# Patient Record
Sex: Female | Born: 1935 | Race: White | Hispanic: No | Smoking: Never smoker
Health system: Southern US, Community
[De-identification: ages and names within clinical notes are randomized; demographics above are authoritative.]

## PROBLEM LIST (undated history)

## (undated) DIAGNOSIS — F0390 Unspecified dementia without behavioral disturbance: Secondary | ICD-10-CM

## (undated) DIAGNOSIS — R569 Unspecified convulsions: Secondary | ICD-10-CM

## (undated) DIAGNOSIS — E039 Hypothyroidism, unspecified: Secondary | ICD-10-CM

## (undated) DIAGNOSIS — I2510 Atherosclerotic heart disease of native coronary artery without angina pectoris: Secondary | ICD-10-CM

## (undated) DIAGNOSIS — I10 Essential (primary) hypertension: Secondary | ICD-10-CM

## (undated) DIAGNOSIS — R3981 Functional urinary incontinence: Secondary | ICD-10-CM

## (undated) DIAGNOSIS — G8929 Other chronic pain: Secondary | ICD-10-CM

## (undated) DIAGNOSIS — M25559 Pain in unspecified hip: Secondary | ICD-10-CM

## (undated) DIAGNOSIS — L97929 Non-pressure chronic ulcer of unspecified part of left lower leg with unspecified severity: Secondary | ICD-10-CM

## (undated) DIAGNOSIS — C801 Malignant (primary) neoplasm, unspecified: Secondary | ICD-10-CM

## (undated) DIAGNOSIS — M549 Dorsalgia, unspecified: Secondary | ICD-10-CM

## (undated) HISTORY — PX: OTHER SURGICAL HISTORY: SHX169

## (undated) HISTORY — PX: ABDOMINAL HYSTERECTOMY: SHX81

## (undated) HISTORY — PX: TOTAL HIP ARTHROPLASTY: SHX124

## (undated) HISTORY — PX: BACK SURGERY: SHX140

---

## 2013-06-30 ENCOUNTER — Emergency Department (HOSPITAL_COMMUNITY): Payer: Medicare Other

## 2013-06-30 ENCOUNTER — Observation Stay (HOSPITAL_COMMUNITY)
Admission: EM | Admit: 2013-06-30 | Discharge: 2013-07-02 | Disposition: A | Discharge status: Home / Self Care | Payer: Medicare Other | Attending: Family Medicine | Admitting: Family Medicine

## 2013-06-30 ENCOUNTER — Encounter (HOSPITAL_COMMUNITY): Payer: Self-pay | Admitting: Emergency Medicine

## 2013-06-30 DIAGNOSIS — D721 Eosinophilia, unspecified: Secondary | ICD-10-CM | POA: Diagnosis present

## 2013-06-30 DIAGNOSIS — G40909 Epilepsy, unspecified, not intractable, without status epilepticus: Secondary | ICD-10-CM | POA: Insufficient documentation

## 2013-06-30 DIAGNOSIS — R55 Syncope and collapse: Principal | ICD-10-CM | POA: Diagnosis present

## 2013-06-30 DIAGNOSIS — F0390 Unspecified dementia without behavioral disturbance: Secondary | ICD-10-CM | POA: Insufficient documentation

## 2013-06-30 DIAGNOSIS — L97929 Non-pressure chronic ulcer of unspecified part of left lower leg with unspecified severity: Secondary | ICD-10-CM | POA: Diagnosis present

## 2013-06-30 DIAGNOSIS — R569 Unspecified convulsions: Secondary | ICD-10-CM | POA: Diagnosis present

## 2013-06-30 DIAGNOSIS — E039 Hypothyroidism, unspecified: Secondary | ICD-10-CM | POA: Diagnosis present

## 2013-06-30 DIAGNOSIS — L97909 Non-pressure chronic ulcer of unspecified part of unspecified lower leg with unspecified severity: Secondary | ICD-10-CM | POA: Insufficient documentation

## 2013-06-30 HISTORY — DX: Atherosclerotic heart disease of native coronary artery without angina pectoris: I25.10

## 2013-06-30 HISTORY — DX: Essential (primary) hypertension: I10

## 2013-06-30 LAB — CBC WITH DIFFERENTIAL/PLATELET
Basophils Relative: 1 % (ref 0–1)
Hemoglobin: 12.6 g/dL (ref 12.0–15.0)
Lymphocytes Relative: 25 % (ref 12–46)
MCHC: 35 g/dL (ref 30.0–36.0)
Monocytes Relative: 8 % (ref 3–12)
Neutro Abs: 5 10*3/uL (ref 1.7–7.7)
Neutrophils Relative %: 53 % (ref 43–77)
RBC: 3.74 MIL/uL — ABNORMAL LOW (ref 3.87–5.11)
WBC: 9.3 10*3/uL (ref 4.0–10.5)

## 2013-06-30 LAB — BASIC METABOLIC PANEL
BUN: 19 mg/dL (ref 6–23)
Chloride: 107 mEq/L (ref 96–112)
GFR calc Af Amer: 65 mL/min — ABNORMAL LOW (ref >90)
Potassium: 3.8 mEq/L (ref 3.5–5.1)

## 2013-06-30 LAB — TROPONIN I: Troponin I: <0.3 ng/mL (ref <0.30)

## 2013-06-30 NOTE — ED Notes (Signed)
MD at bedside. 

## 2013-06-30 NOTE — ED Notes (Signed)
Patient presents to ER via [facility] with c/o syncopal episode.  Patient states she stood up and everything went black.  Patient states she landed on the bed.

## 2013-06-30 NOTE — ED Notes (Signed)
Pt reports she stood up out of bed "blacked out" and fell back onto the bed. States that this has happened before in the past and has informed her PCP with no answers being given. Denies injury.

## 2013-06-30 NOTE — ED Provider Notes (Signed)
CSN: [id number]     Arrival date & time 06/30/13  2108 History  This chart was scribed for [employer]. Rubin Payor, MD by Greggory Stallion, ED Scribe. This patient was seen in room APA05/APA05 and the patient's care was started at 10:15 PM.   Chief Complaint  Patient presents with  . Loss of Consciousness   The history is provided by the patient. No language interpreter was used.    HPI Comments: [name] [name] is a 79 y.o. female who presents to the Emergency Department complaining of sudden loss of consciousness. She states she was standing in front of the closet and just blacked out. Pt states she landed on the bed. She felt fine until then. Pt denies CP, SOB, headache, dysuria, urinary frequency and confusion as associated symptoms.   Past Medical History  Diagnosis Date  . Hypertension   . Coronary artery disease    History reviewed. No pertinent past surgical history. No family history on file. History  Substance Use Topics  . Smoking status: Never Smoker   . Smokeless tobacco: Not on file  . Alcohol Use: No   OB History   Grav Para Term Preterm Abortions TAB SAB Ect Mult Living                 Review of Systems  Respiratory: Negative for shortness of breath.   Cardiovascular: Negative for chest pain.  Genitourinary: Negative for dysuria and frequency.  Neurological: Positive for syncope. Negative for headaches.  Psychiatric/Behavioral: Negative for confusion.  All other systems reviewed and are negative.    Allergies  Feldene  Home Medications   Current Outpatient Rx  Name  Route  Sig  Dispense  Refill  . aspirin EC 81 MG tablet   Oral   Take 81 mg by mouth daily.         . Calcium Carbonate-Vitamin D (CALCIUM 600 + D PO)   Oral   Take 1 tablet by mouth daily.         . celecoxib (CELEBREX) 200 MG capsule   Oral   Take 200 mg by mouth daily.         . citalopram (CELEXA) 20 MG tablet   Oral   Take 20 mg by mouth daily.         . Cyanocobalamin  (VITAMIN B-12) 2500 MCG SUBL   Sublingual   Place 1 tablet under the tongue daily.         Marland Kitchen levothyroxine (SYNTHROID, LEVOTHROID) 50 MCG tablet   Oral   Take 50 mcg by mouth daily before breakfast.         . omeprazole (PRILOSEC) 20 MG capsule   Oral   Take 20 mg by mouth daily before breakfast.         . silver sulfADIAZINE (SILVADENE) 1 % cream   Topical   Apply 1 application topically 2 (two) times daily. Applied to wound on left leg twice daily and cleaned with Wound Wash         . simvastatin (ZOCOR) 20 MG tablet   Oral   Take 20 mg by mouth at bedtime.         . topiramate (TOPAMAX) 100 MG tablet   Oral   Take 100 mg by mouth at bedtime.         . vitamin C (ASCORBIC ACID) 500 MG tablet   Oral   Take 500 mg by mouth daily.         Marland Kitchen  vitamin E 200 UNIT capsule   Oral   Take 200 Units by mouth daily.         . vitamin E 400 UNIT capsule   Oral   Take 400 Units by mouth every Wednesday.          BP 180/78  Pulse 71  Temp(Src) 98 F (36.7 C) (Oral)  Resp 18  Ht 5\' 2"  (1.575 m)  Wt 150 lb (68.04 kg)  BMI 27.43 kg/m2  SpO2 97%  Physical Exam  Nursing note and vitals reviewed. Constitutional: She is oriented to person, place, and time. She appears well-developed and well-nourished. No distress.  Mild forgetfulness.   HENT:  Head: Normocephalic and atraumatic.  Eyes: EOM are normal.  Neck: Normal range of motion.  Cardiovascular: Normal rate, regular rhythm and normal heart sounds.   Pulmonary/Chest: Effort normal and breath sounds normal.  Abdominal: Soft. She exhibits no distension. There is no tenderness.  Musculoskeletal: Normal range of motion.  Neurological: She is alert and oriented to person, place, and time.  Skin: Skin is warm and dry.  Psychiatric: She has a normal mood and affect. Judgment normal.    ED Course   Procedures (including critical care time)  COORDINATION OF CARE: 10:20 PM-Discussed treatment plan which  includes EKG and labs with pt at bedside and pt agreed to plan.   Results for orders placed during the hospital encounter of 06/30/13  CBC WITH DIFFERENTIAL      Result Value Range   WBC 9.3  4.0 - 10.5 K/uL   RBC 3.74 (*) 3.87 - 5.11 MIL/uL   Hemoglobin 12.6  12.0 - 15.0 g/dL   HCT 16.1  09.6 - 04.5 %   MCV 96.3  78.0 - 100.0 fL   MCH 33.7  26.0 - 34.0 pg   MCHC 35.0  30.0 - 36.0 g/dL   RDW 40.9  81.1 - 91.4 %   Platelets 195  150 - 400 K/uL   Neutrophils Relative % 53  43 - 77 %   Neutro Abs 5.0  1.7 - 7.7 K/uL   Lymphocytes Relative 25  12 - 46 %   Lymphs Abs 2.3  0.7 - 4.0 K/uL   Monocytes Relative 8  3 - 12 %   Monocytes Absolute 0.7  0.1 - 1.0 K/uL   Eosinophils Relative 13 (*) 0 - 5 %   Eosinophils Absolute 1.2 (*) 0.0 - 0.7 K/uL   Basophils Relative 1  0 - 1 %   Basophils Absolute 0.1  0.0 - 0.1 K/uL  BASIC METABOLIC PANEL      Result Value Range   Sodium 138  135 - 145 mEq/L   Potassium 3.8  3.5 - 5.1 mEq/L   Chloride 107  96 - 112 mEq/L   CO2 21  19 - 32 mEq/L   Glucose, Bld 122 (*) 70 - 99 mg/dL   BUN 19  6 - 23 mg/dL   Creatinine, Ser 7.82  0.50 - 1.10 mg/dL   Calcium 95.6  8.4 - 21.3 mg/dL   GFR calc non Af Amer 56 (*) >90 mL/min   GFR calc Af Amer 65 (*) >90 mL/min  TROPONIN I      Result Value Range   Troponin I <0.30  <0.30 ng/mL   No results found.  Labs Reviewed  CBC WITH DIFFERENTIAL - Abnormal; Notable for the following:    RBC 3.74 (*)    Eosinophils Relative 13 (*)    Eosinophils  Absolute 1.2 (*)    All other components within normal limits  BASIC METABOLIC PANEL - Abnormal; Notable for the following:    Glucose, Bld 122 (*)    GFR calc non Af Amer 56 (*)    GFR calc Af Amer 65 (*)    All other components within normal limits  TROPONIN I  URINALYSIS, ROUTINE W REFLEX MICROSCOPIC   Dg Chest 2 View  06/30/2013   *RADIOLOGY REPORT*  Clinical Data: Syncopal episode, chronic back pain  CHEST - 2 VIEW  Comparison: None.  Findings: Normal heart  size and vascularity.  Mild chronic bronchitic changes and COPD/emphysema.  Negative for CHF or pneumonia.  No effusion or pneumothorax.  Trachea midline.  Chronic compression fracture at approximately L1 on the lateral view.  IMPRESSION: Chronic bronchitic changes and COPD/emphysema.  No superimposed acute process   Original Report Authenticated By: Judie Petit. Shick, M.D.   1. Syncope     Date: 07/01/2013  Rate: 70  Rhythm: normal sinus rhythm  QRS Axis: normal  Intervals: normal  ST/T Wave abnormalities: normal  Conduction Disutrbances:none  Narrative Interpretation:   Old EKG Reviewed: none available   MDM  Patient with syncope while standing in from the closet. EKG and labwork reassuring. She is at her baseline. She will be admitted to internal medicine due to the syncope. Discussed with Dr. Orvan Falconer     I personally performed the services described in this documentation, which was scribed in my presence. The recorded information has been reviewed and is accurate.    Juliet Rude. Rubin Payor, MD 07/01/13 4098

## 2013-07-01 ENCOUNTER — Encounter (HOSPITAL_COMMUNITY): Payer: Self-pay | Admitting: *Deleted

## 2013-07-01 DIAGNOSIS — R569 Unspecified convulsions: Secondary | ICD-10-CM | POA: Diagnosis present

## 2013-07-01 DIAGNOSIS — L97929 Non-pressure chronic ulcer of unspecified part of left lower leg with unspecified severity: Secondary | ICD-10-CM | POA: Diagnosis present

## 2013-07-01 DIAGNOSIS — R55 Syncope and collapse: Secondary | ICD-10-CM | POA: Diagnosis present

## 2013-07-01 DIAGNOSIS — E039 Hypothyroidism, unspecified: Secondary | ICD-10-CM

## 2013-07-01 DIAGNOSIS — I359 Nonrheumatic aortic valve disorder, unspecified: Secondary | ICD-10-CM

## 2013-07-01 LAB — BASIC METABOLIC PANEL
Calcium: 10.3 mg/dL (ref 8.4–10.5)
Chloride: 107 mEq/L (ref 96–112)
Creatinine, Ser: 0.85 mg/dL (ref 0.50–1.10)
GFR calc Af Amer: 66 mL/min — ABNORMAL LOW (ref >90)
Sodium: 139 mEq/L (ref 135–145)

## 2013-07-01 LAB — HEPATIC FUNCTION PANEL
AST: 19 U/L (ref 0–37)
Albumin: 3.7 g/dL (ref 3.5–5.2)

## 2013-07-01 LAB — URINALYSIS, ROUTINE W REFLEX MICROSCOPIC
Hgb urine dipstick: NEGATIVE
Nitrite: NEGATIVE
Specific Gravity, Urine: 1.01 (ref 1.005–1.030)
Urobilinogen, UA: 0.2 mg/dL (ref 0.0–1.0)

## 2013-07-01 LAB — CBC
HCT: 36 % (ref 36.0–46.0)
Platelets: 175 10*3/uL (ref 150–400)
RBC: 3.74 MIL/uL — ABNORMAL LOW (ref 3.87–5.11)
RDW: 12.4 % (ref 11.5–15.5)
WBC: 7.9 10*3/uL (ref 4.0–10.5)

## 2013-07-01 LAB — PROTIME-INR: Prothrombin Time: 13.9 seconds (ref 11.6–15.2)

## 2013-07-01 LAB — MRSA PCR SCREENING: MRSA by PCR: NEGATIVE

## 2013-07-01 LAB — HEMOGLOBIN A1C
Hgb A1c MFr Bld: 5.5 % (ref <5.7)
Mean Plasma Glucose: 111 mg/dL (ref <117)

## 2013-07-01 MED ORDER — ONDANSETRON HCL 4 MG/2ML IJ SOLN: 4.0000 mg | INTRAMUSCULAR | Status: DC | PRN

## 2013-07-01 MED ORDER — BISACODYL 5 MG PO TBEC: 5.0000 mg | DELAYED_RELEASE_TABLET | Freq: Every day | ORAL | Status: DC | PRN

## 2013-07-01 MED ORDER — ENOXAPARIN SODIUM 40 MG/0.4ML ~~LOC~~ SOLN
40.0000 mg | SUBCUTANEOUS | Status: DC
  Administered 2013-07-02: 40 mg via SUBCUTANEOUS
  Filled 2013-07-01: qty 0.4

## 2013-07-01 MED ORDER — LEVOTHYROXINE SODIUM 25 MCG PO TABS
50.0000 ug | ORAL_TABLET | Freq: Every day | ORAL | Status: DC
  Administered 2013-07-01 – 2013-07-02 (×2): 50 ug via ORAL
  Filled 2013-07-01 (×2): qty 2

## 2013-07-01 MED ORDER — POLYETHYLENE GLYCOL 3350 17 G PO PACK: 17.0000 g | PACK | Freq: Every day | ORAL | Status: DC | PRN

## 2013-07-01 MED ORDER — CITALOPRAM HYDROBROMIDE 20 MG PO TABS
20.0000 mg | ORAL_TABLET | Freq: Every day | ORAL | Status: DC
Start: 2013-07-01 — End: 2013-07-02
  Administered 2013-07-01 – 2013-07-02 (×2): 20 mg via ORAL
  Filled 2013-07-01 (×2): qty 1

## 2013-07-01 MED ORDER — POTASSIUM CHLORIDE IN NACL 20-0.9 MEQ/L-% IV SOLN
INTRAVENOUS | Status: DC
  Administered 2013-07-01 – 2013-07-02 (×3): via INTRAVENOUS

## 2013-07-01 MED ORDER — ENOXAPARIN SODIUM 30 MG/0.3ML ~~LOC~~ SOLN
30.0000 mg | SUBCUTANEOUS | Status: DC
  Administered 2013-07-01: 30 mg via SUBCUTANEOUS
  Filled 2013-07-01: qty 0.3

## 2013-07-01 MED ORDER — CELECOXIB 100 MG PO CAPS
200.0000 mg | ORAL_CAPSULE | Freq: Every day | ORAL | Status: DC
  Administered 2013-07-01: 200 mg via ORAL
  Filled 2013-07-01: qty 1
  Filled 2013-07-01 (×3): qty 2

## 2013-07-01 MED ORDER — TRAZODONE HCL 50 MG PO TABS: 25.0000 mg | ORAL_TABLET | Freq: Every evening | ORAL | Status: DC | PRN

## 2013-07-01 MED ORDER — SILVER SULFADIAZINE 1 % EX CREA
1.0000 "application " | TOPICAL_CREAM | Freq: Two times a day (BID) | CUTANEOUS | Status: DC
  Administered 2013-07-01 (×3): 1 via TOPICAL
  Filled 2013-07-01: qty 85

## 2013-07-01 MED ORDER — SIMVASTATIN 20 MG PO TABS
20.0000 mg | ORAL_TABLET | Freq: Every day | ORAL | Status: DC
  Administered 2013-07-01: 20 mg via ORAL
  Filled 2013-07-01: qty 1

## 2013-07-01 MED ORDER — ASPIRIN EC 81 MG PO TBEC
81.0000 mg | DELAYED_RELEASE_TABLET | Freq: Every day | ORAL | Status: DC
Start: 2013-07-01 — End: 2013-07-02
  Administered 2013-07-01 – 2013-07-02 (×2): 81 mg via ORAL
  Filled 2013-07-01 (×2): qty 1

## 2013-07-01 MED ORDER — PANTOPRAZOLE SODIUM 40 MG PO TBEC
40.0000 mg | DELAYED_RELEASE_TABLET | Freq: Every day | ORAL | Status: DC
  Administered 2013-07-01 – 2013-07-02 (×2): 40 mg via ORAL
  Filled 2013-07-01 (×2): qty 1

## 2013-07-01 MED ORDER — ACETAMINOPHEN 325 MG PO TABS: 650.0000 mg | ORAL_TABLET | ORAL | Status: DC | PRN

## 2013-07-01 MED ORDER — FLEET ENEMA 7-19 GM/118ML RE ENEM: 1.0000 | ENEMA | Freq: Once | RECTAL | Status: AC | PRN

## 2013-07-01 MED ORDER — TOPIRAMATE 25 MG PO TABS
ORAL_TABLET | ORAL | Status: AC
  Filled 2013-07-01: qty 4

## 2013-07-01 MED ORDER — SILVER SULFADIAZINE 1 % EX CREA
TOPICAL_CREAM | CUTANEOUS | Status: AC
  Filled 2013-07-01: qty 50

## 2013-07-01 MED ORDER — TOPIRAMATE 100 MG PO TABS
100.0000 mg | ORAL_TABLET | Freq: Every day | ORAL | Status: DC
  Administered 2013-07-01 (×2): 100 mg via ORAL
  Filled 2013-07-01 (×4): qty 1

## 2013-07-01 NOTE — Evaluation (Signed)
Physical Therapy Evaluation Patient Details Name: [name] [name] MRN: [id number] DOB: 12-08-1917 Today's Date: 07/01/2013 Time: 4132-4401 PT Time Calculation (min): 21 min  PT Assessment / Plan / Recommendation History of Present Illness  Pt is admitted with syncope.  She has chronic right hip and lumbar pain and uses a walker for gait.  She is normally independent at home, lives with her son with good family support.  Clinical Impression  Pt is seen for evaluation and found to be at prior functional level.  She had no reported dizziness with any change of position and gait is stable with a walker.  No further PT is indicated.    PT Assessment  Patent does not need any further PT services    Follow Up Recommendations  No PT follow up    Does the patient have the potential to tolerate intense rehabilitation      Barriers to Discharge        Equipment Recommendations  None recommended by PT    Recommendations for Other Services     Frequency      Precautions / Restrictions Precautions Precautions: None Restrictions Weight Bearing Restrictions: No   Pertinent Vitals/Pain       Mobility  Bed Mobility Bed Mobility: Supine to Sit Supine to Sit: 6: Modified independent (Device/Increase time);HOB flat Transfers Transfers: Sit to Stand;Stand to Sit Sit to Stand: 6: Modified independent (Device/Increase time);From bed Stand to Sit: 6: Modified independent (Device/Increase time);To chair/3-in-1 Ambulation/Gait Ambulation/Gait Assistance: 6: Modified independent (Device/Increase time) Ambulation Distance (Feet): 200 Feet Assistive device: Rolling walker Gait Pattern: Within Functional Limits Gait velocity: WNL Stairs: No Wheelchair Mobility Wheelchair Mobility: No    Exercises     PT Diagnosis:    PT Problem List:   PT Treatment Interventions:       PT Goals(Current goals can be found in the care plan section) Acute Rehab PT Goals PT Goal Formulation: No goals set,  d/c therapy  Visit Information  Last PT Received On: 07/01/13 History of Present Illness: Pt is admitted with syncope.  She has chronic right hip and lumbar pain and uses a walker for gait.  She is normally independent at home, lives with her son with good family support.       Prior Functioning  Home Living Family/patient expects to be discharged to:: Private residence Living Arrangements: Children Available Help at Discharge: Available 24 hours/day Type of Home: House Home Access: Stairs to enter [employer] of Steps: 1 Entrance Stairs-Rails: None Home Layout: One level Home Equipment: Environmental consultant - 2 wheels;Cane - single point Prior Function Level of Independence: Independent with assistive device(s) Communication Communication: No difficulties    Cognition  Cognition Arousal/Alertness: Awake/alert Behavior During Therapy: WFL for tasks assessed/performed Overall Cognitive Status: Within Functional Limits for tasks assessed    Extremity/Trunk Assessment Lower Extremity Assessment Lower Extremity Assessment: Overall WFL for tasks assessed Cervical / Trunk Assessment Cervical / Trunk Assessment: Normal   Balance Balance Balance Assessed:  (WNL by functional observation)  End of Session PT - End of Session Equipment Utilized During Treatment: Gait belt Activity Tolerance: Patient tolerated treatment well Patient left: in chair;with call bell/phone within reach;with nursing/sitter in room Nurse Communication: Mobility status  GP     Konrad Penta 07/01/2013, 10:37 AM

## 2013-07-01 NOTE — Progress Notes (Signed)
Occupational Therapy Screen  OT orders received. Patient's chart reviewed. Patient lives with son and functions independently with assistive devices. At this time, patient does not present with any deficits and is functioning at baseline. No further acute OT needs at this time; will sign off.   Limmie Patricia, OTR/L,CBIS  07/01/13 1:36PM

## 2013-07-01 NOTE — Care Management Note (Unsigned)
    Page 1 of 1   07/01/2013     3:27:34 PM   CARE MANAGEMENT NOTE 07/01/2013  Patient:  [name] [name]   Account Number:  [id number]  Date Initiated:  07/01/2013  Documentation initiated by:  Sharrie Rothman  Subjective/Objective Assessment:   Pt admitted from home with syncopal episodes. Pt lives with her son. Has a cane and walker for home use. Pt follows up with [facility]. Pt is fairly independent with ADL's.     Action/Plan:   No CM needs noted. Pt potential discharge over the weekend.   Anticipated DC Date:  07/02/2013   Anticipated DC Plan:  HOME/SELF CARE      DC Planning Services  CM consult      Choice offered to / List presented to:             Status of service:  Completed, signed off Medicare Important Message given?   (If response is "NO", the following Medicare IM given date fields will be blank) Date Medicare IM given:   Date Additional Medicare IM given:    Discharge Disposition:  HOME/SELF CARE  Per UR Regulation:    If discussed at Long Length of Stay Meetings, dates discussed:    Comments:  07/01/13 1530 Arlyss Queen, RN BSN CM

## 2013-07-01 NOTE — Progress Notes (Signed)
Late Entry: 2115 Patient resting both eyes closed and in no apparent distress. -pt. awakened in order medicate and redress wound to LLE  When asked if the patient was in need of anything at this time the patient decline. -stable vitals noted

## 2013-07-01 NOTE — Progress Notes (Signed)
UR chart review completed.  

## 2013-07-01 NOTE — Progress Notes (Signed)
*  PRELIMINARY RESULTS* Echocardiogram 2D Echocardiogram has been performed.  [name] [name] 07/01/2013, 10:19 AM

## 2013-07-01 NOTE — H&P (Addendum)
[facility] History and Physical  [name] [name]  ZOX:[id number]  DOB: 06-Feb-1918   DOA: 06/30/2013   PCP:   [facility] family medicine Cardiologist: Daryel November. MD in [place] Neurologist: In [place]  Chief Complaint:  Recurrent syncope this evening  HPI: [name] [name] is a 79 y.o. female.  Elderly Caucasian lady who lives with her son; reports she was standing at a close closet when she suddenly passed out and fell backwards onto the bed. She quickly woke up but every time she sat up she felt dizzy and passed out again and fell backwards onto the bed. She was brought to the emergency room where she was evaluated and found to be orthostatic and dizzy with standing, but no other acute problems found.  She denies chest pains or palpitations; denies nausea or vomiting; reports she has been eating well but does feel thirsty; she has chronic incontinence  Denies any recent change in her medications; Takes Topamax chronically for seizure disorder  Rewiew of Systems:   All systems negative except as marked bold or noted in the HPI;  Constitutional:    malaise, fever and chills. ;  Eyes:   eye pain, redness and discharge. ;  ENMT:   ear pain, hoarseness, nasal congestion, sinus pressure and sore throat. ;  Cardiovascular:    chest pain, palpitations, diaphoresis, dyspnea and peripheral edema.  Respiratory:   cough, hemoptysis, wheezing and stridor. ;  Gastrointestinal:  nausea, vomiting, diarrhea, constipation, abdominal pain, melena, blood in stool, hematemesis, jaundice and rectal bleeding. unusual weight loss..   Genitourinary:    frequency, dysuria, incontinence,flank pain and hematuria; Musculoskeletal:   back pain and neck pain.  swelling and trauma.;  Skin: .  pruritus, rash, abrasions, bruising and skin lesion.;  chronic left leg ulcer for  years Neuro:    headache, lightheadedness and neck stiffness.  weakness, altered level of consciousness, altered mental status, extremity  weakness, burning feet, involuntary movement, seizure and syncope.  Psych:    anxiety, depression, insomnia, tearfulness, panic attacks, hallucinations, paranoia, suicidal or homicidal ideation    Past Medical History  Diagnosis Date  . Hypertension   . Coronary artery disease     History reviewed. No pertinent past surgical history.  Medications:  HOME MEDS: Prior to Admission medications   Medication Sig Start Date End Date Taking? Authorizing Provider  aspirin EC 81 MG tablet Take 81 mg by mouth daily.   Yes Historical Provider, MD  Calcium Carbonate-Vitamin D (CALCIUM 600 + D PO) Take 1 tablet by mouth daily.   Yes Historical Provider, MD  celecoxib (CELEBREX) 200 MG capsule Take 200 mg by mouth daily.   Yes Historical Provider, MD  citalopram (CELEXA) 20 MG tablet Take 20 mg by mouth daily.   Yes Historical Provider, MD  Cyanocobalamin (VITAMIN B-12) 2500 MCG SUBL Place 1 tablet under the tongue daily.   Yes Historical Provider, MD  levothyroxine (SYNTHROID, LEVOTHROID) 50 MCG tablet Take 50 mcg by mouth daily before breakfast.   Yes Historical Provider, MD  omeprazole (PRILOSEC) 20 MG capsule Take 20 mg by mouth daily before breakfast.   Yes Historical Provider, MD  silver sulfADIAZINE (SILVADENE) 1 % cream Apply 1 application topically 2 (two) times daily. Applied to wound on left leg twice daily and cleaned with Wound Wash   Yes Historical Provider, MD  simvastatin (ZOCOR) 20 MG tablet Take 20 mg by mouth at bedtime.   Yes Historical Provider, MD  topiramate (TOPAMAX) 100 MG tablet Take 100 mg  by mouth at bedtime.   Yes Historical Provider, MD  vitamin C (ASCORBIC ACID) 500 MG tablet Take 500 mg by mouth daily.   Yes Historical Provider, MD  vitamin E 200 UNIT capsule Take 200 Units by mouth daily.   Yes Historical Provider, MD  vitamin E 400 UNIT capsule Take 400 Units by mouth every Wednesday.   Yes Historical Provider, MD     Allergies:  Allergies  Allergen Reactions  .  Feldene (Piroxicam) Rash    Social History:   reports that she has never smoked. She does not have any smokeless tobacco history on file. She reports that she does not drink alcohol or use illicit drugs.  Family History: History reviewed. No pertinent family history.   Physical Exam: Filed Vitals:   06/30/13 2112 07/01/13 0023  BP: 180/78 144/58  Pulse: 71 70  Temp: 98 F (36.7 C)   TempSrc: Oral   Resp: 18 18  Height: 5\' 2"  (1.575 m)   Weight: 68.04 kg (150 lb)   SpO2: 97% 96%   Blood pressure 144/58, pulse 70, temperature 98 F (36.7 C), temperature source Oral, resp. rate 18, height 5\' 2"  (1.575 m), weight 68.04 kg (150 lb), SpO2 96.00%. Body mass index is 27.43 kg/(m^2).   GEN:  Pleasant elderly Caucasian lying bed in no acute distress; cooperative with exam PSYCH:  alert and oriented x4;  neither anxious nor depressed; affect is appropriate. HEENT: Mucous membranes pink DRY and anicteric; PERRLA; EOM intact; no cervical lymphadenopathy nor thyromegaly or carotid bruit; no JVD; Breasts:: Not examined CHEST WALL: No tenderness CHEST: Normal respiration, clear to auscultation bilaterally HEART: Regular rate and rhythm; no murmurs rubs or gallops BACK: No kyphosis no scoliosis; no CVA tenderness ABDOMEN:  soft non-tender; no masses, no organomegaly, normal abdominal bowel sounds; no pannus; no intertriginous candida. Rectal Exam: Not done EXTREMITIES:  age-appropriate arthropathy of the hands and knees;  half centimeter ulcer at the mid left leg; extensive area of surrounding erythema but no warmth; the patient says there've been no new changes to this  Genitalia: not examined PULSES: 2+ and symmetric SKIN: Normal hydration no rash or ulceration CNS: Cranial nerves 2-12 grossly intact no focal lateralizing neurologic deficit   Labs on Admission:  Basic Metabolic Panel:  Recent Labs Lab 06/30/13 2226  NA 138  K 3.8  CL 107  CO2 21  GLUCOSE 122*  BUN 19   CREATININE 0.86  CALCIUM 10.4   Liver Function Tests: No results found for this basename: AST, ALT, ALKPHOS, BILITOT, PROT, ALBUMIN,  in the last 168 hours No results found for this basename: LIPASE, AMYLASE,  in the last 168 hours No results found for this basename: AMMONIA,  in the last 168 hours CBC:  Recent Labs Lab 06/30/13 2226  WBC 9.3  NEUTROABS 5.0  HGB 12.6  HCT 36.0  MCV 96.3  PLT 195   Cardiac Enzymes:  Recent Labs Lab 06/30/13 2226  TROPONINI <0.30   BNP: No components found with this basename: POCBNP,  D-dimer: No components found with this basename: D-DIMER,  CBG: No results found for this basename: GLUCAP,  in the last 168 hours  Radiological Exams on Admission: Dg Chest 2 View  06/30/2013   *RADIOLOGY REPORT*  Clinical Data: Syncopal episode, chronic back pain  CHEST - 2 VIEW  Comparison: None.  Findings: Normal heart size and vascularity.  Mild chronic bronchitic changes and COPD/emphysema.  Negative for CHF or pneumonia.  No effusion or pneumothorax.  Trachea midline.  Chronic compression fracture at approximately L1 on the lateral view.  IMPRESSION: Chronic bronchitic changes and COPD/emphysema.  No superimposed acute process   Original Report Authenticated By: Judie Petit. Miles Costain, M.D.      Assessment/Plan    Active Problems:   Syncope Dehydration clinical   Chronic ulcer of left leg   Seizures   Unspecified hypothyroidism   Eosinophilia, unclear etiology   PLAN: We'll admit this lady for hydration, cardiac monitoring, 2-D echo, PT evaluation  Other plans as per orders.  Code Status: Patient confirms that she is DO NOT RESUSCITATE; she reports she is previously discussed this with her son but not recent Family Communication: No family available at present Disposition Plan: Likely home in a day or 2    Yovanni Frenette Nocturnist [facility] Pager [phone]   07/01/2013, 1:24 AM

## 2013-07-01 NOTE — Progress Notes (Signed)
[facility] PROGRESS NOTE  [name] [name] ZOX:[id number] DOB: 26-Feb-1918 DOA: 06/30/2013 PCP: No primary provider on file. [facility]  Cardiologist: Daryel November. MD in [place] Neurologist: In [place]  Assessment/Plan: 1. Syncope: Although orthostasis was reported as in a record of this. Her history is very suggestive though of vasovagal syncope. No focal deficits and no history to suggest stroke or seizure. Likely secondary to relative dehydration secondary to her consumption of coffee and otherwise poor oral intake. Physical therapy consultation, echocardiogram, monitor overnight. 2. Hypothyroidism: Check TSH. 3. Eosinophilia: Significance unclear. Followup as an outpatient 4. History of seizure disorder: No evidence of acute issues. Continue Topamax  5. Chronic left lower extremity ulcer continue levothyroxine. 6. Dementia: Stable.   Followup 2-D echocardiogram, PT evaluation  Continue telemetry  Likely home 8/9  Discussed with son at bedside, the patient fell in May and since that time has been living with him. She has some dementia and requires some close watching to prevent falls. She has not previously had syncope as far as he is aware although this may have happened prior to coming to live with him. He was with her yesterday and reports that she several times attempted to setup, became lightheaded and passed out with transient loss of consciousness. There was no seizure activity or focal deficits. He notes that she drinks quite a bit of coffee each day and does not drink much otherwise in the way of fluids.  Pending studies:   TSH  Hemoglobin A1c  Code Status: DNR DVT prophylaxis: Lovenox Family Communication: Discussed as above Disposition Plan: Likely home in 24 hours  Brendia Sacks, MD  [facility]  Pager [phone] If 7PM-7AM, please contact night-coverage at [email or web address], password [facility] 07/01/2013, 7:41 AM  LOS: 1 day   Clinical  Summary: 79 year old woman lives at home with her son, was standing by the closet when she suddenly passed out and fell onto the bed. She quickly woke up but every time she attempted to get up she felt lightheaded and passed out again.  Consultants:    Procedures:  2-D echocardiogram  HPI/Subjective: Overall she feels somewhat better today. No lightheadedness or syncope since admission. No chest pain or trouble breathing. Generalized weakness but no focal deficits.  Objective: Filed Vitals:   07/01/13 0130 07/01/13 0630 07/01/13 0635 07/01/13 0645  BP: 145/75 163/88 164/76 174/134  Pulse: 67     Temp: 98 F (36.7 C)     TempSrc: Oral     Resp:      Height: 5\' 2"  (1.575 m)     Weight: 63.7 kg (140 lb 6.9 oz)     SpO2: 96%       Intake/Output Summary (Last 24 hours) at 07/01/13 0741 Last data filed at 07/01/13 0700  Gross per 24 hour  Intake      0 ml  Output   1300 ml  Net  -1300 ml     Filed Weights   06/30/13 2112 07/01/13 0130  Weight: 68.04 kg (150 lb) 63.7 kg (140 lb 6.9 oz)    Exam:   Afebrile, vital signs stable, somewhat hypertensive negative orthostatics although pulse was not checked.  General: Appears calm and comfortable.  Psychiatric: Grossly normal affect. Speech fluent and appropriate.  Cardiovascular: Regular rate and rhythm. No murmur, rub, gallop. No lower extremity edema.  Respiratory: Clear to auscultation bilaterally. No wheezes, rales, rhonchi. Normal respiratory effort.  Abdomen: Soft  Musculoskeletal: Grossly normal tone and strength. No focal deficits.  Data  Reviewed:  Complete metabolic panel unremarkable  Troponin negative x2  CBC unremarkable  Urinalysis negative   Chest x-ray no acute process, chronic bronchitic changes and COPD  EKG normal sinus rhythm, no acute changes  Scheduled Meds: . aspirin EC  81 mg Oral Daily  . celecoxib  200 mg Oral Daily  . citalopram  20 mg Oral Daily  . enoxaparin (LOVENOX)  injection  30 mg Subcutaneous Q24H  . levothyroxine  50 mcg Oral QAC breakfast  . pantoprazole  40 mg Oral Daily  . silver sulfADIAZINE  1 application Topical BID  . simvastatin  20 mg Oral QHS  . topiramate  100 mg Oral QHS   Continuous Infusions: . 0.9 % NaCl with KCl 20 mEq / L 75 mL/hr at 07/01/13 0215    Active Problems:   Syncope   Chronic ulcer of left leg   Seizures   Unspecified hypothyroidism   Eosinophilia   Time spent 25 minutes

## 2013-07-02 NOTE — Progress Notes (Signed)
Patient discharged to home with son. Instructions given to son and patient regarding diet, follow-up, meds, activity, and reasons to seek medical care. IV and foley removed. Pt's son expressed understanding of instructions.

## 2013-07-02 NOTE — Progress Notes (Signed)
[facility] PROGRESS NOTE  [name] [name] ZOX:[id number] DOB: 1918/11/21 DOA: 06/30/2013 PCP: Daryel November, MD [facility]  Cardiologist: Daryel November. MD in [place] Neurologist: In [place]  Assessment/Plan: 1. Syncope: No recurrence. The echocardiogram reassuring. TSH normal. Telemetry unremarkable. Her history is very suggestive though of vasovagal syncope. No focal deficits and no history to suggest stroke or seizure. Likely secondary to relative dehydration secondary to her consumption of coffee and otherwise poor oral intake.  2. Eosinophilia: Significance unclear. Followup as an outpatient 3. History of seizure disorder: No evidence of acute issues. Continue Topamax  4. Chronic left lower extremity ulcer: Stable. 5. Hypothyroidism: Stable, continue levothyroxine. 6. Dementia: Stable.   Home today  Code Status: DNR DVT prophylaxis: Lovenox Family Communication: Discussed with son at bedside. Disposition Plan: As above.  Brendia Sacks, MD  [facility]  Pager [phone] If 7PM-7AM, please contact night-coverage at [email or web address], password [facility] 07/02/2013, 11:16 AM  LOS: 2 days   Clinical Summary: 79 year old woman lives at home with her son, was standing by the closet when she suddenly passed out and fell onto the bed. She quickly woke up but every time she attempted to get up she felt lightheaded and passed out again.  Consultants:  Physical therapy: No followup recommended.  Occupational therapy: No followup recommended.  Procedures:  2-D echocardiogram: Left ventricular ejection fraction 60-65%. Grade 1 diastolic dysfunction.  HPI/Subjective: No complaints. No issues overnight. Did well with physical therapy yesterday. No syncope.  Objective: Filed Vitals:   07/02/13 0300 07/02/13 0400 07/02/13 0500 07/02/13 0800  BP:  152/72  181/108  Pulse:      Temp:  98.6 F (37 C)  98.5 F (36.9 C)  TempSrc:  Oral  Oral  Resp: 19 20 20 14   Height:       Weight:   65.4 kg (144 lb 2.9 oz)   SpO2:        Intake/Output Summary (Last 24 hours) at 07/02/13 1116 Last data filed at 07/02/13 0600  Gross per 24 hour  Intake   1515 ml  Output   1100 ml  Net    415 ml     Filed Weights   06/30/13 2112 07/01/13 0130 07/02/13 0500  Weight: 68.04 kg (150 lb) 63.7 kg (140 lb 6.9 oz) 65.4 kg (144 lb 2.9 oz)    Exam:   Afebrile, vital signs stable, somewhat labile hypertension.  Psychiatric: Grossly normal mood and affect. Speech fluent and appropriate.  Left lower extremity: Fairly superficial chronic wound without evidence of infection. No erythema. Over the anterior tibia.  Cardiovascular: Regular rate and rhythm. No murmur, rub, gallop. No lower extremity edema.  Respiratory: Clear to auscultation bilaterally. No wheezes, rales, rhonchi. Normal respiratory effort.  Musculoskeletal: Grossly normal tone. Both feet warm and dry.  Data Reviewed:  2-D echocardiogram noted.  TSH normal.  Scheduled Meds: . aspirin EC  81 mg Oral Daily  . celecoxib  200 mg Oral Daily  . citalopram  20 mg Oral Daily  . enoxaparin (LOVENOX) injection  40 mg Subcutaneous Q24H  . levothyroxine  50 mcg Oral QAC breakfast  . pantoprazole  40 mg Oral Daily  . silver sulfADIAZINE  1 application Topical BID  . simvastatin  20 mg Oral QHS  . topiramate  100 mg Oral QHS   Continuous Infusions: . 0.9 % NaCl with KCl 20 mEq / L 75 mL/hr at 07/02/13 0600    Active Problems:   Syncope   Chronic ulcer of left leg  Seizures   Unspecified hypothyroidism   Eosinophilia

## 2013-07-02 NOTE — Discharge Summary (Signed)
Physician Discharge Summary  [name] [name] ZOX:[id number] DOB: Apr 11, 1918 DOA: 06/30/2013  PCP: Daryel November, MD  Admit date: 06/30/2013 Discharge date: 07/02/2013  Recommendations for Outpatient Follow-up:  1. Followup syncope, see discussion below 2. Followup eosinophilia as clinically indicated.  Follow-up Information   Follow up with [facility], [place] In 1 month.   Contact information:   [address] [place] [address] [phone]       Follow up with PCP In 1 week.     Discharge Diagnoses:  1. Syncope 2. Hypothyroidism 3. History of seizure disorder 4. Eosinophilia 5. Chronic lower extremity ulcer 6. Dementia  Discharge Condition: Improved Disposition: Home  Diet recommendation: Regular. Decrease caffeine and coffee consumption. Increase non-sugary drink consumption.  Filed Weights   06/30/13 2112 07/01/13 0130 07/02/13 0500  Weight: 68.04 kg (150 lb) 63.7 kg (140 lb 6.9 oz) 65.4 kg (144 lb 2.9 oz)    History of present illness:  79 year old woman lives at home with her son, was standing by the closet when she suddenly passed out and fell onto the bed. She quickly woke up but every time she attempted to get up she felt lightheaded and passed out again.  Hospital Course:  Ms. Hagin was admitted for further evaluation of syncope. Telemetry reveals sinus rhythm with no arrhythmias. 2-D echocardiogram was reassuring and TSH was normal. No evidence of orthostasis during this hospitalization, no recurrence of syncope. No focal neurologic deficits or history to suggest seizure. Did well with physical therapy and has no outpatient needs. History suggests vasovagal syncope. She consumes a large amount of coffee each day and drinks relatively little else. She is now stable for discharge. Other issues as below.  1. Syncope: No recurrence. The echocardiogram reassuring. TSH normal. Telemetry unremarkable. Her history is very suggestive though of vasovagal syncope. No focal  deficits and no history to suggest stroke or seizure. Likely secondary to relative dehydration secondary to her consumption of coffee and otherwise poor oral intake.  2. Eosinophilia: Significance unclear. Followup as an outpatient 3. History of seizure disorder: No evidence of acute issues. Continue Topamax  4. Chronic left lower extremity ulcer: Stable. 5. Hypothyroidism: Stable, continue levothyroxine. 6. Dementia: Stable.   Discharge Instructions  Discharge Orders   Future Orders Complete By Expires     Activity as tolerated - No restrictions  As directed     Diet general  As directed     Discharge instructions  As directed     Comments:      Followup with your primary care physician as directed. Take your time in making changes in position. Decrease consumption of caffeine and coffee. Increased consumption of water or non-sugared drinks. Call your physician or seek immediate medical attention for passing out or worsening of your condition.        Medication List         aspirin EC 81 MG tablet  Take 81 mg by mouth daily.     CALCIUM 600 + D PO  Take 1 tablet by mouth daily.     celecoxib 200 MG capsule  Commonly known as:  CELEBREX  Take 200 mg by mouth daily.     citalopram 20 MG tablet  Commonly known as:  CELEXA  Take 20 mg by mouth daily.     levothyroxine 50 MCG tablet  Commonly known as:  SYNTHROID, LEVOTHROID  Take 50 mcg by mouth daily before breakfast.     omeprazole 20 MG capsule  Commonly known as:  PRILOSEC  Take 20 mg by mouth daily before breakfast.     silver sulfADIAZINE 1 % cream  Commonly known as:  SILVADENE  Apply 1 application topically 2 (two) times daily. Applied to wound on left leg twice daily and cleaned with Wound Wash     simvastatin 20 MG tablet  Commonly known as:  ZOCOR  Take 20 mg by mouth at bedtime.     topiramate 100 MG tablet  Commonly known as:  TOPAMAX  Take 100 mg by mouth at bedtime.     Vitamin B-12 2500 MCG Subl   Place 1 tablet under the tongue daily.     vitamin C 500 MG tablet  Commonly known as:  ASCORBIC ACID  Take 500 mg by mouth daily.     vitamin E 200 UNIT capsule  Take 200 Units by mouth daily.     vitamin E 400 UNIT capsule  Take 400 Units by mouth every Wednesday.       Allergies  Allergen Reactions  . Feldene (Piroxicam) Rash    The results of significant diagnostics from this hospitalization (including imaging, microbiology, ancillary and laboratory) are listed below for reference.    Significant Diagnostic Studies: Dg Chest 2 View  06/30/2013   *RADIOLOGY REPORT*  Clinical Data: Syncopal episode, chronic back pain  CHEST - 2 VIEW  Comparison: None.  Findings: Normal heart size and vascularity.  Mild chronic bronchitic changes and COPD/emphysema.  Negative for CHF or pneumonia.  No effusion or pneumothorax.  Trachea midline.  Chronic compression fracture at approximately L1 on the lateral view.  IMPRESSION: Chronic bronchitic changes and COPD/emphysema.  No superimposed acute process   Original Report Authenticated By: Judie Petit. Miles Costain, M.D.    Microbiology: Recent Results (from the past 240 hour(s))  MRSA PCR SCREENING     Status: None   Collection Time    07/01/13  1:10 AM      Result Value Range Status   MRSA by PCR NEGATIVE  NEGATIVE Final   Comment:            The GeneXpert MRSA Assay (FDA     approved for NASAL specimens     only), is one component of a     comprehensive MRSA colonization     surveillance program. It is not     intended to diagnose MRSA     infection nor to guide or     monitor treatment for     MRSA infections.     Labs: Basic Metabolic Panel:  Recent Labs Lab 06/30/13 2226 07/01/13 0244  NA 138 139  K 3.8 3.8  CL 107 107  CO2 21 21  GLUCOSE 122* 111*  BUN 19 18  CREATININE 0.86 0.85  CALCIUM 10.4 10.3  MG  --  2.0   Liver Function Tests:  Recent Labs Lab 07/01/13 0244  AST 19  ALT 8  ALKPHOS 70  BILITOT 0.3  PROT 6.4   ALBUMIN 3.7   CBC:  Recent Labs Lab 06/30/13 2226 07/01/13 0244  WBC 9.3 7.9  NEUTROABS 5.0  --   HGB 12.6 12.6  HCT 36.0 36.0  MCV 96.3 96.3  PLT 195 175   Cardiac Enzymes:  Recent Labs Lab 06/30/13 2226 07/01/13 0158 07/01/13 0815 07/01/13 1331  TROPONINI <0.30 <0.30 <0.30 <0.30    Active Problems:   Syncope   Chronic ulcer of left leg   Seizures   Unspecified hypothyroidism   Eosinophilia   Time coordinating discharge:  25 minutes  Signed:  Brendia Sacks, MD [facility] 07/02/2013, 11:35 AM

## 2014-01-29 ENCOUNTER — Emergency Department (HOSPITAL_COMMUNITY): Payer: Medicare Other

## 2014-01-29 ENCOUNTER — Encounter (HOSPITAL_COMMUNITY): Payer: Self-pay | Admitting: Emergency Medicine

## 2014-01-29 ENCOUNTER — Observation Stay (HOSPITAL_COMMUNITY)
Admission: EM | Admit: 2014-01-29 | Discharge: 2014-01-30 | Disposition: A | Discharge status: Home Health | Payer: Medicare Other | Attending: Family Medicine | Admitting: Family Medicine

## 2014-01-29 DIAGNOSIS — Z7982 Long term (current) use of aspirin: Secondary | ICD-10-CM | POA: Insufficient documentation

## 2014-01-29 DIAGNOSIS — E039 Hypothyroidism, unspecified: Secondary | ICD-10-CM | POA: Insufficient documentation

## 2014-01-29 DIAGNOSIS — R55 Syncope and collapse: Principal | ICD-10-CM | POA: Insufficient documentation

## 2014-01-29 DIAGNOSIS — I2510 Atherosclerotic heart disease of native coronary artery without angina pectoris: Secondary | ICD-10-CM | POA: Insufficient documentation

## 2014-01-29 DIAGNOSIS — I6789 Other cerebrovascular disease: Secondary | ICD-10-CM | POA: Insufficient documentation

## 2014-01-29 DIAGNOSIS — I10 Essential (primary) hypertension: Secondary | ICD-10-CM | POA: Insufficient documentation

## 2014-01-29 DIAGNOSIS — M25559 Pain in unspecified hip: Secondary | ICD-10-CM | POA: Insufficient documentation

## 2014-01-29 DIAGNOSIS — L97909 Non-pressure chronic ulcer of unspecified part of unspecified lower leg with unspecified severity: Secondary | ICD-10-CM | POA: Insufficient documentation

## 2014-01-29 DIAGNOSIS — G8929 Other chronic pain: Secondary | ICD-10-CM | POA: Insufficient documentation

## 2014-01-29 DIAGNOSIS — F0390 Unspecified dementia without behavioral disturbance: Secondary | ICD-10-CM | POA: Insufficient documentation

## 2014-01-29 DIAGNOSIS — M549 Dorsalgia, unspecified: Secondary | ICD-10-CM | POA: Insufficient documentation

## 2014-01-29 DIAGNOSIS — R3981 Functional urinary incontinence: Secondary | ICD-10-CM | POA: Insufficient documentation

## 2014-01-29 DIAGNOSIS — N390 Urinary tract infection, site not specified: Secondary | ICD-10-CM | POA: Insufficient documentation

## 2014-01-29 HISTORY — DX: Other chronic pain: G89.29

## 2014-01-29 HISTORY — DX: Unspecified dementia, unspecified severity, without behavioral disturbance, psychotic disturbance, mood disturbance, and anxiety: F03.90

## 2014-01-29 HISTORY — DX: Functional urinary incontinence: R39.81

## 2014-01-29 HISTORY — DX: Pain in unspecified hip: M25.559

## 2014-01-29 HISTORY — DX: Hypothyroidism, unspecified: E03.9

## 2014-01-29 HISTORY — DX: Dorsalgia, unspecified: M54.9

## 2014-01-29 HISTORY — DX: Non-pressure chronic ulcer of unspecified part of left lower leg with unspecified severity: L97.929

## 2014-01-29 LAB — I-STAT CHEM 8, ED
BUN: 18 mg/dL (ref 6–23)
Calcium, Ion: 1.46 mmol/L — ABNORMAL HIGH (ref 1.13–1.30)
Chloride: 106 mEq/L (ref 96–112)
Creatinine, Ser: 1 mg/dL (ref 0.50–1.10)
GLUCOSE: 95 mg/dL (ref 70–99)
HCT: 41 % (ref 36.0–46.0)
HEMOGLOBIN: 13.9 g/dL (ref 12.0–15.0)
POTASSIUM: 4.2 meq/L (ref 3.7–5.3)
SODIUM: 142 meq/L (ref 137–147)
TCO2: 21 mmol/L (ref 0–100)

## 2014-01-29 LAB — CBC
HCT: 38.6 % (ref 36.0–46.0)
Hemoglobin: 13.5 g/dL (ref 12.0–15.0)
MCH: 33.8 pg (ref 26.0–34.0)
MCHC: 35 g/dL (ref 30.0–36.0)
MCV: 96.5 fL (ref 78.0–100.0)
Platelets: 181 10*3/uL (ref 150–400)
RBC: 4 MIL/uL (ref 3.87–5.11)
RDW: 12.6 % (ref 11.5–15.5)
WBC: 7.5 10*3/uL (ref 4.0–10.5)

## 2014-01-29 LAB — COMPREHENSIVE METABOLIC PANEL
ALK PHOS: 65 U/L (ref 39–117)
ALT: 10 U/L (ref 0–35)
AST: 17 U/L (ref 0–37)
Albumin: 3.9 g/dL (ref 3.5–5.2)
BUN: 18 mg/dL (ref 6–23)
CALCIUM: 10.3 mg/dL (ref 8.4–10.5)
CO2: 23 mEq/L (ref 19–32)
Chloride: 107 mEq/L (ref 96–112)
Creatinine, Ser: 0.96 mg/dL (ref 0.50–1.10)
GFR calc Af Amer: 57 mL/min — ABNORMAL LOW (ref >90)
GFR calc non Af Amer: 49 mL/min — ABNORMAL LOW (ref >90)
Glucose, Bld: 102 mg/dL — ABNORMAL HIGH (ref 70–99)
POTASSIUM: 4.4 meq/L (ref 3.7–5.3)
SODIUM: 141 meq/L (ref 137–147)
TOTAL PROTEIN: 6.6 g/dL (ref 6.0–8.3)
Total Bilirubin: 0.3 mg/dL (ref 0.3–1.2)

## 2014-01-29 LAB — URINALYSIS, ROUTINE W REFLEX MICROSCOPIC
Bilirubin Urine: NEGATIVE
Glucose, UA: NEGATIVE mg/dL
KETONES UR: NEGATIVE mg/dL
LEUKOCYTES UA: NEGATIVE
NITRITE: POSITIVE — AB
PROTEIN: NEGATIVE mg/dL
Specific Gravity, Urine: 1.01 (ref 1.005–1.030)
UROBILINOGEN UA: 0.2 mg/dL (ref 0.0–1.0)
pH: 7 (ref 5.0–8.0)

## 2014-01-29 LAB — RAPID URINE DRUG SCREEN, HOSP PERFORMED
AMPHETAMINES: NOT DETECTED
BENZODIAZEPINES: NOT DETECTED
Barbiturates: NOT DETECTED
Cocaine: NOT DETECTED
Opiates: NOT DETECTED
TETRAHYDROCANNABINOL: NOT DETECTED

## 2014-01-29 LAB — DIFFERENTIAL
BASOS ABS: 0.1 10*3/uL (ref 0.0–0.1)
Basophils Relative: 1 % (ref 0–1)
EOS ABS: 0.5 10*3/uL (ref 0.0–0.7)
EOS PCT: 7 % — AB (ref 0–5)
Lymphocytes Relative: 26 % (ref 12–46)
Lymphs Abs: 2 10*3/uL (ref 0.7–4.0)
Monocytes Absolute: 0.4 10*3/uL (ref 0.1–1.0)
Monocytes Relative: 5 % (ref 3–12)
NEUTROS PCT: 61 % (ref 43–77)
Neutro Abs: 4.6 10*3/uL (ref 1.7–7.7)

## 2014-01-29 LAB — PROTIME-INR
INR: 1.07 (ref 0.00–1.49)
Prothrombin Time: 13.7 seconds (ref 11.6–15.2)

## 2014-01-29 LAB — URINE MICROSCOPIC-ADD ON

## 2014-01-29 LAB — MRSA PCR SCREENING: MRSA by PCR: NEGATIVE

## 2014-01-29 LAB — ETHANOL: Alcohol, Ethyl (B): <11 mg/dL (ref 0–11)

## 2014-01-29 LAB — APTT: aPTT: 30 seconds (ref 24–37)

## 2014-01-29 LAB — I-STAT TROPONIN, ED: Troponin i, poc: 0.01 ng/mL (ref 0.00–0.08)

## 2014-01-29 MED ORDER — CELECOXIB 100 MG PO CAPS
200.0000 mg | ORAL_CAPSULE | Freq: Every day | ORAL | Status: DC
  Administered 2014-01-30: 200 mg via ORAL
  Filled 2014-01-29: qty 1
  Filled 2014-01-29 (×2): qty 2

## 2014-01-29 MED ORDER — SODIUM CHLORIDE 0.9 % IJ SOLN
3.0000 mL | Freq: Two times a day (BID) | INTRAMUSCULAR | Status: DC
  Administered 2014-01-29 – 2014-01-30 (×2): 3 mL via INTRAVENOUS

## 2014-01-29 MED ORDER — SIMVASTATIN 20 MG PO TABS
20.0000 mg | ORAL_TABLET | Freq: Every day | ORAL | Status: DC
  Administered 2014-01-29: 20 mg via ORAL
  Filled 2014-01-29: qty 1

## 2014-01-29 MED ORDER — ACETAMINOPHEN 650 MG RE SUPP: 650.0000 mg | Freq: Four times a day (QID) | RECTAL | Status: DC | PRN

## 2014-01-29 MED ORDER — ONDANSETRON HCL 4 MG/2ML IJ SOLN: 4.0000 mg | Freq: Four times a day (QID) | INTRAMUSCULAR | Status: DC | PRN

## 2014-01-29 MED ORDER — ACETAMINOPHEN 325 MG PO TABS: 650.0000 mg | ORAL_TABLET | Freq: Four times a day (QID) | ORAL | Status: DC | PRN

## 2014-01-29 MED ORDER — TOPIRAMATE 100 MG PO TABS
100.0000 mg | ORAL_TABLET | Freq: Every day | ORAL | Status: DC
  Administered 2014-01-29: 100 mg via ORAL
  Filled 2014-01-29 (×3): qty 1

## 2014-01-29 MED ORDER — SODIUM CHLORIDE 0.9 % IJ SOLN
3.0000 mL | Freq: Two times a day (BID) | INTRAMUSCULAR | Status: DC
  Administered 2014-01-30: 3 mL via INTRAVENOUS

## 2014-01-29 MED ORDER — SODIUM CHLORIDE 0.9 % IJ SOLN: 3.0000 mL | INTRAMUSCULAR | Status: DC | PRN

## 2014-01-29 MED ORDER — LEVOTHYROXINE SODIUM 50 MCG PO TABS
50.0000 ug | ORAL_TABLET | Freq: Every day | ORAL | Status: DC
  Administered 2014-01-30: 50 ug via ORAL
  Filled 2014-01-29: qty 2
  Filled 2014-01-29 (×3): qty 1

## 2014-01-29 MED ORDER — CITALOPRAM HYDROBROMIDE 20 MG PO TABS
20.0000 mg | ORAL_TABLET | Freq: Every day | ORAL | Status: DC
  Administered 2014-01-30: 20 mg via ORAL
  Filled 2014-01-29: qty 1

## 2014-01-29 MED ORDER — ASPIRIN EC 81 MG PO TBEC
81.0000 mg | DELAYED_RELEASE_TABLET | Freq: Every day | ORAL | Status: DC
  Administered 2014-01-30: 81 mg via ORAL
  Filled 2014-01-29: qty 1

## 2014-01-29 MED ORDER — ONDANSETRON HCL 4 MG PO TABS: 4.0000 mg | ORAL_TABLET | Freq: Four times a day (QID) | ORAL | Status: DC | PRN

## 2014-01-29 MED ORDER — PANTOPRAZOLE SODIUM 40 MG PO TBEC
40.0000 mg | DELAYED_RELEASE_TABLET | Freq: Every day | ORAL | Status: DC
  Administered 2014-01-30: 40 mg via ORAL
  Filled 2014-01-29: qty 1

## 2014-01-29 MED ORDER — TOPIRAMATE 25 MG PO TABS
ORAL_TABLET | ORAL | Status: AC
Start: 2014-01-29 — End: 2014-01-29
  Filled 2014-01-29: qty 4

## 2014-01-29 MED ORDER — SODIUM CHLORIDE 0.9 % IV SOLN: 250.0000 mL | INTRAVENOUS | Status: DC | PRN

## 2014-01-29 NOTE — ED Notes (Addendum)
This morning not able to ambulate and having some blurred vision.  Last known normal time was 0940am.   Per ems--right hand and left lower extremity weakness.  cbg was 128.  nsr on monitor.  Per ems states when they went to get pt up to stretcher she about blacked out.  Pt was admitted in September with syncopal episodes.   Neuro assessment normal upon arrival .  Does have droop to left eye which son states is her baseline.  Per pt states she normally has blurred vision which is worse today.

## 2014-01-29 NOTE — H&P (Addendum)
History and Physical  Lethia Donlon ZDG:[id number] DOB: [birth year] DOA: 01/29/2014  Referring physician: Francine Graven, MD in ED PCP: No primary provider on file. [facility]  Chief Complaint: passed out  HPI:  80 year old woman presented emergency department today with history of syncope and possible weakness of the hand and leg. Initial evaluation was unremarkable, no evidence of focal deficit and the patient was referred for observation.  History obtained from patient as well as son at bedside. Patient has been in her usual state of health and felt fine yesterday. Today she was at the kitchen sink washing dishes when she became lightheaded and "felt like I was going to pass out". She walked back to her bedroom where she lay down on the bed and does not recall what happened afterwards. When she awoke she did complain of some blurred vision and generalized numbness all over. She complained that she couldn't walk. She was last seen normal approximately 10 AM when "she was in good spirits" per her son. Son returned from church, found her laying in her bed. Mentation was normal as was speech and there was no focal deficits noted. Although the chart reports family was concerned about weakness of the right hand and left leg, son who discovered her denies that he saw any weakness. EMS was called and when attempting to ambulate the patient she passed out. Per documentation there is no evidence of focal deficits by EMS. Currently the patient only complains of some blurred vision but no other complaints.  In the emergency department afebrile, one low BP likely artifact. Hypertensive. Stable vitals. No hypoxia. CMP, troponin, CBC normal. Equivocal U/A. Negative UDS. EKG normal sinus rhythm, no acute changes, independently reviewed. CT of the head and chest x-ray unremarkable. EDP reported nonfocal neurologic exam.  Of note she was admitted 06/2013 for syncope at which time her 2-D echocardiogram and  TSH were unremarkable. Vasovagal syncope was suspected.  Review of Systems:  Negative for fever, sore throat, rash, new muscle aches, chest pain, SOB, dysuria, bleeding, n/v/abdominal pain.  Past Medical History  Diagnosis Date  . Hypertension   . Coronary artery disease   . Dementia   . Chronic ulcer of left leg   . Chronic back pain   . Chronic hip pain   . Urinary incontinence due to cognitive impairment   . Hypothyroidism     Past Surgical History  Procedure Laterality Date  . Abdominal hysterectomy    . Lumbar back surgery      Social History:  reports that she has never smoked. She does not have any smokeless tobacco history on file. She reports that she does not drink alcohol or use illicit drugs.  Allergies  Allergen Reactions  . Feldene [Piroxicam] Rash    Family History  Problem Relation Age of Onset  . Cancer Sister      Prior to Admission medications   Medication Sig Start Date End Date Taking? Authorizing Provider  aspirin EC 81 MG tablet Take 81 mg by mouth daily.   Yes Historical Provider, MD  Calcium Carbonate-Vitamin D (CALCIUM 600 + D PO) Take 1 tablet by mouth daily.   Yes Historical Provider, MD  celecoxib (CELEBREX) 200 MG capsule Take 200 mg by mouth daily.   Yes Historical Provider, MD  citalopram (CELEXA) 20 MG tablet Take 20 mg by mouth daily.   Yes Historical Provider, MD  Cyanocobalamin (VITAMIN B-12) 2500 MCG SUBL Place 1 tablet under the tongue daily.   Yes Historical  Provider, MD  HYDROcodone-acetaminophen (NORCO) 10-325 MG per tablet Take 1 tablet by mouth every 6 (six) hours as needed for moderate pain.   Yes Historical Provider, MD  levothyroxine (SYNTHROID, LEVOTHROID) 50 MCG tablet Take 50 mcg by mouth daily before breakfast.   Yes Historical Provider, MD  omeprazole (PRILOSEC) 20 MG capsule Take 20 mg by mouth daily before breakfast.   Yes Historical Provider, MD  simvastatin (ZOCOR) 20 MG tablet Take 20 mg by mouth at bedtime.   Yes  Historical Provider, MD  topiramate (TOPAMAX) 100 MG tablet Take 100 mg by mouth at bedtime.   Yes Historical Provider, MD  vitamin C (ASCORBIC ACID) 500 MG tablet Take 500 mg by mouth daily.   Yes Historical Provider, MD  vitamin E 200 UNIT capsule Take 200 Units by mouth daily.   Yes Historical Provider, MD   Physical Exam: Filed Vitals:   01/29/14 1504 01/29/14 1518 01/29/14 1520 01/29/14 1700  BP: 174/86 162/81 174/86 162/79  Pulse: 95 86 94 63  Temp:      TempSrc:      Resp:  16 24 20   SpO2:  96% 96% 96%   General: Examined in the emergency department. Appears calm and comfortable Eyes: PERRL, irises appear unremarkable. Ptosis left eye noted. ENT: Somewhat hard of hearing. Lips and tongue appear unremarkable. Neck: no LAD, masses or thyromegaly Cardiovascular: RRR, no m/r/g. No LE edema. Respiratory: CTA bilaterally, no w/r/r. Normal respiratory effort. Abdomen: soft, ntnd Skin: no rash or induration seen. Well healed incision over lumbar spine.  Musculoskeletal: grossly normal tone BUE/BLE. Excellent eye lateral lower extremity strength which is symmetric, able to lift both legs off the bed without difficulty. Bilateral upper extremity strength symmetric and appears normal. Psychiatric: grossly normal mood and affect, speech fluent and appropriate Neurologic: Cranial nerves 2-12 intact. No pronator drift. No upper extremity dysdiadochokinesis.  Patient is able to follow commands without difficulty. She cannot clearly perform finger to nose without difficulty seeing, was able to recognize family members and perform other commands by imitating my movements. No visual field deficits noted.  Wt Readings from Last 3 Encounters:  07/02/13 65.4 kg (144 lb 2.9 oz)    Labs on Admission:  Basic Metabolic Panel:  Recent Labs Lab 01/29/14 1443 01/29/14 1511  NA 141 142  K 4.4 4.2  CL 107 106  CO2 23  --   GLUCOSE 102* 95  BUN 18 18  CREATININE 0.96 1.00  CALCIUM 10.3  --      Liver Function Tests:  Recent Labs Lab 01/29/14 1443  AST 17  ALT 10  ALKPHOS 65  BILITOT 0.3  PROT 6.6  ALBUMIN 3.9    CBC:  Recent Labs Lab 01/29/14 1443 01/29/14 1511  WBC 7.5  --   NEUTROABS 4.6  --   HGB 13.5 13.9  HCT 38.6 41.0  MCV 96.5  --   PLT 181  --      Recent Labs  01/29/14 1510  TROPIPOC 0.01     Radiological Exams on Admission: Ct Head Wo Contrast  01/29/2014   CLINICAL DATA:  Hypertension. Dementia. Right hand and left leg weakness. Difficulty ambulating.  EXAM: CT HEAD WITHOUT CONTRAST  TECHNIQUE: Contiguous axial images were obtained from the base of the skull through the vertex without intravenous contrast.  COMPARISON:  None.  FINDINGS: The brainstem, cerebellum, cerebral peduncles, thalamus, basal ganglia, basilar cisterns, and ventricular system appear within normal limits. No intracranial hemorrhage, mass lesion, or acute CVA.  There is atherosclerotic calcification of the cavernous carotid arteries bilaterally.  IMPRESSION: 1. No acute intracranial findings to explain the patient's symptoms.   Electronically Signed   By: Sherryl Barters M.D.   On: 01/29/2014 16:23   Dg Chest Portable 1 View  01/29/2014   CLINICAL DATA:  Blurred vision.  EXAM: PORTABLE CHEST - 1 VIEW  COMPARISON:  06/30/2013  FINDINGS: Prominent lung markings appear to be chronic. No evidence for airspace disease or frank pulmonary edema. Stable appearance of the heart and mediastinum. Trachea is midline.  IMPRESSION: Chronic lung changes without acute findings.   Electronically Signed   By: Markus Daft M.D.   On: 01/29/2014 15:40    EKG: Independently reviewed. As above.   Principal Problem:   Syncope Active Problems:   Unspecified hypothyroidism   Dementia   Assessment/Plan 1. Syncope. History very suggestive of vasovagal phenomenon. No evidence to suggest stroke or TIA. There is chart documentation by ED RN that family was concerned about weakness, however as noted  above son denies this. No focal deficits per EMS or EDP. No orthostatic hypotension in ED. although the patient complains of some blurred vision, she is able to participate in examination without obvious visual deficit. 2. Dementia. Appears to be at baseline.   Plan observation, telemetry. No signs or symptoms to suggest ACS. Check MRI of the brain but if unremarkable would not pursue further evaluation. She had an echocardiogram less than 1 year ago and do not think this requires repeating at this point.  Check urinalysis.  Check orthostatics.  PT consult.  Discussed clinical impression, laboratory studies, imaging and treatment plan with son and daughter-in-law at bedside.  Code Status: DNR per HCPOA/son  DVT prophylaxis:SCDs Family Communication:  Disposition Plan/Anticipated LOS: obs, <24 hours  Time spent: 72 minutes  Murray Hodgkins, MD  [facility] Pager [phone] 01/29/2014, 5:59 PM

## 2014-01-29 NOTE — ED Provider Notes (Signed)
CSN: [id number]     Arrival date & time 01/29/14  1356 History   First MD Initiated Contact with Patient 01/29/14 1414     Chief Complaint  Patient presents with  . Weakness  . Blurred Vision      Patient is a 80 y.o. female presenting with weakness. The history is provided by the patient, the EMS personnel and a caregiver. The history is limited by the condition of the patient (Hx dementia).  Weakness  Pt was seen at 1430.  Per EMS, family, and pt: c/o pt with gradual onset and persistence of constant generalized weakness that began this morning. Pt's family states pt was up walking with her walker "acting like normal" before they went to church approximately 0940. When they came back from church approximately 1230, pt's family found pt laying on her bed, stating she "couldn't walk" and her vision was "blurry."  Pt's family thought pt's right hand and LLE "were weak." When EMS arrived, no focal neuro deficits were noted. EMS states they stood pt up to get on their stretcher and she "passed out." Family that was on scene confirms this. Pt's family states pt's baseline is left upper eyelid ptosis as well as "blurry vision," both of which are present today and unchanged. Pt has significant hx of dementia and currently denies any complaints. Denies CP/SOB, no cough, no abd pain, no N/V/D, no fevers, no current focal motor weakness, no tingling/numbness in extremities.      Past Medical History  Diagnosis Date  . Hypertension   . Coronary artery disease   . Dementia   . Chronic ulcer of left leg   . Chronic back pain   . Chronic hip pain   . Urinary incontinence due to cognitive impairment    History reviewed. No pertinent past surgical history.  History  Substance Use Topics  . Smoking status: Never Smoker   . Smokeless tobacco: Not on file  . Alcohol Use: No    Review of Systems  Unable to perform ROS: Dementia  Neurological: Positive for weakness.    Allergies   Feldene  Home Medications   Current Outpatient Rx  Name  Route  Sig  Dispense  Refill  . aspirin EC 81 MG tablet   Oral   Take 81 mg by mouth daily.         . Calcium Carbonate-Vitamin D (CALCIUM 600 + D PO)   Oral   Take 1 tablet by mouth daily.         . celecoxib (CELEBREX) 200 MG capsule   Oral   Take 200 mg by mouth daily.         . citalopram (CELEXA) 20 MG tablet   Oral   Take 20 mg by mouth daily.         . Cyanocobalamin (VITAMIN B-12) 2500 MCG SUBL   Sublingual   Place 1 tablet under the tongue daily.         Marland Kitchen HYDROcodone-acetaminophen (NORCO) 10-325 MG per tablet   Oral   Take 1 tablet by mouth every 6 (six) hours as needed for moderate pain.         Marland Kitchen levothyroxine (SYNTHROID, LEVOTHROID) 50 MCG tablet   Oral   Take 50 mcg by mouth daily before breakfast.         . omeprazole (PRILOSEC) 20 MG capsule   Oral   Take 20 mg by mouth daily before breakfast.         .  simvastatin (ZOCOR) 20 MG tablet   Oral   Take 20 mg by mouth at bedtime.         . topiramate (TOPAMAX) 100 MG tablet   Oral   Take 100 mg by mouth at bedtime.         . vitamin C (ASCORBIC ACID) 500 MG tablet   Oral   Take 500 mg by mouth daily.         . vitamin E 200 UNIT capsule   Oral   Take 200 Units by mouth daily.          BP 174/86  Pulse 95  Temp(Src) 97.9 F (36.6 C) (Oral)  Resp 19  SpO2 96% Physical Exam 1435: Physical examination:  Nursing notes reviewed; Vital signs and O2 SAT reviewed;  Constitutional: Well developed, Well nourished, In no acute distress; Head:  Normocephalic, atraumatic; Eyes: EOMI, PERRL, No scleral icterus; ENMT: Mouth and pharynx normal, Mucous membranes dry; Neck: Supple, Full range of motion, No lymphadenopathy; Cardiovascular: Regular rate and rhythm, No gallop; Respiratory: Breath sounds clear & equal bilaterally, No rales, rhonchi, wheezes.  Speaking full sentences with ease, Normal respiratory effort/excursion;  Chest: Nontender, Movement normal; Abdomen: Soft, Nontender, Nondistended, Normal bowel sounds; Genitourinary: No CVA tenderness; Extremities: Pulses normal, No tenderness, No edema, No calf edema or asymmetry.; Neuro: Awake, alert, confused re: time, place, events per hx dementia. Major CN grossly intact.Speech clear.  +left upper eyelid ptosis per hx per family at bedside. No facial droop.  No nystagmus. Grips equal. Strength 5/5 equal bilat UE's and LE's.  DTR 2/4 equal bilat UE's and LE's.  No gross sensory deficits.  Normal cerebellar testing bilat UE's (finger-nose) and LE's (heel-shin).; Skin: Color normal, Warm, Dry.   ED Course  Procedures     EKG Interpretation   Date/Time:  Sunday January 29 2014 13:57:43 EDT Ventricular Rate:  75 PR Interval:  194 QRS Duration: 74 QT Interval:  390 QTC Calculation: 435 R Axis:   16 Text Interpretation:  Normal sinus rhythm Normal ECG When compared with  ECG of 30-Jun-2013 21:30, No significant change was found Confirmed by  [facility]  MD, Nunzio Cory [phone]) on 01/29/2014 3:14:37 PM      MDM  MDM Reviewed: previous chart, nursing note and vitals Reviewed previous: labs and ECG Interpretation: labs, ECG, x-ray and CT scan    Results for orders placed during the hospital encounter of 01/29/14  ETHANOL      Result Value Ref Range   Alcohol, Ethyl (B) <11  0 - 11 mg/dL  PROTIME-INR      Result Value Ref Range   Prothrombin Time 13.7  11.6 - 15.2 seconds   INR 1.07  0.00 - 1.49  APTT      Result Value Ref Range   aPTT 30  24 - 37 seconds  CBC      Result Value Ref Range   WBC 7.5  4.0 - 10.5 K/uL   RBC 4.00  3.87 - 5.11 MIL/uL   Hemoglobin 13.5  12.0 - 15.0 g/dL   HCT 38.6  36.0 - 46.0 %   MCV 96.5  78.0 - 100.0 fL   MCH 33.8  26.0 - 34.0 pg   MCHC 35.0  30.0 - 36.0 g/dL   RDW 12.6  11.5 - 15.5 %   Platelets 181  150 - 400 K/uL  DIFFERENTIAL      Result Value Ref Range   Neutrophils Relative % 61  43 - 77 %  Neutro Abs 4.6  1.7  - 7.7 K/uL   Lymphocytes Relative 26  12 - 46 %   Lymphs Abs 2.0  0.7 - 4.0 K/uL   Monocytes Relative 5  3 - 12 %   Monocytes Absolute 0.4  0.1 - 1.0 K/uL   Eosinophils Relative 7 (*) 0 - 5 %   Eosinophils Absolute 0.5  0.0 - 0.7 K/uL   Basophils Relative 1  0 - 1 %   Basophils Absolute 0.1  0.0 - 0.1 K/uL  COMPREHENSIVE METABOLIC PANEL      Result Value Ref Range   Sodium 141  137 - 147 mEq/L   Potassium 4.4  3.7 - 5.3 mEq/L   Chloride 107  96 - 112 mEq/L   CO2 23  19 - 32 mEq/L   Glucose, Bld 102 (*) 70 - 99 mg/dL   BUN 18  6 - 23 mg/dL   Creatinine, Ser 0.96  0.50 - 1.10 mg/dL   Calcium 10.3  8.4 - 10.5 mg/dL   Total Protein 6.6  6.0 - 8.3 g/dL   Albumin 3.9  3.5 - 5.2 g/dL   AST 17  0 - 37 U/L   ALT 10  0 - 35 U/L   Alkaline Phosphatase 65  39 - 117 U/L   Total Bilirubin 0.3  0.3 - 1.2 mg/dL   GFR calc non Af Amer 49 (*) >90 mL/min   GFR calc Af Amer 57 (*) >90 mL/min  URINE RAPID DRUG SCREEN (HOSP PERFORMED)      Result Value Ref Range   Opiates NONE DETECTED  NONE DETECTED   Cocaine NONE DETECTED  NONE DETECTED   Benzodiazepines NONE DETECTED  NONE DETECTED   Amphetamines NONE DETECTED  NONE DETECTED   Tetrahydrocannabinol NONE DETECTED  NONE DETECTED   Barbiturates NONE DETECTED  NONE DETECTED  URINALYSIS, ROUTINE W REFLEX MICROSCOPIC      Result Value Ref Range   Color, Urine YELLOW  YELLOW   APPearance CLOUDY (*) CLEAR   Specific Gravity, Urine 1.010  1.005 - 1.030   pH 7.0  5.0 - 8.0   Glucose, UA NEGATIVE  NEGATIVE mg/dL   Hgb urine dipstick TRACE (*) NEGATIVE   Bilirubin Urine NEGATIVE  NEGATIVE   Ketones, ur NEGATIVE  NEGATIVE mg/dL   Protein, ur NEGATIVE  NEGATIVE mg/dL   Urobilinogen, UA 0.2  0.0 - 1.0 mg/dL   Nitrite POSITIVE (*) NEGATIVE   Leukocytes, UA NEGATIVE  NEGATIVE  URINE MICROSCOPIC-ADD ON      Result Value Ref Range   Squamous Epithelial / LPF RARE  RARE   RBC / HPF 0-2  <3 RBC/hpf   Casts HYALINE CASTS (*) NEGATIVE  I-STAT CHEM 8, ED       Result Value Ref Range   Sodium 142  137 - 147 mEq/L   Potassium 4.2  3.7 - 5.3 mEq/L   Chloride 106  96 - 112 mEq/L   BUN 18  6 - 23 mg/dL   Creatinine, Ser 1.00  0.50 - 1.10 mg/dL   Glucose, Bld 95  70 - 99 mg/dL   Calcium, Ion 1.46 (*) 1.13 - 1.30 mmol/L   TCO2 21  0 - 100 mmol/L   Hemoglobin 13.9  12.0 - 15.0 g/dL   HCT 41.0  36.0 - 46.0 %  I-STAT TROPOININ, ED      Result Value Ref Range   Troponin i, poc 0.01  0.00 - 0.08 ng/mL   Comment 3  Ct Head Wo Contrast 01/29/2014   CLINICAL DATA:  Hypertension. Dementia. Right hand and left leg weakness. Difficulty ambulating.  EXAM: CT HEAD WITHOUT CONTRAST  TECHNIQUE: Contiguous axial images were obtained from the base of the skull through the vertex without intravenous contrast.  COMPARISON:  None.  FINDINGS: The brainstem, cerebellum, cerebral peduncles, thalamus, basal ganglia, basilar cisterns, and ventricular system appear within normal limits. No intracranial hemorrhage, mass lesion, or acute CVA. There is atherosclerotic calcification of the cavernous carotid arteries bilaterally.  IMPRESSION: 1. No acute intracranial findings to explain the patient's symptoms.   Electronically Signed   By: Sherryl Barters M.D.   On: 01/29/2014 16:23   Dg Chest Portable 1 View 01/29/2014   CLINICAL DATA:  Blurred vision.  EXAM: PORTABLE CHEST - 1 VIEW  COMPARISON:  06/30/2013  FINDINGS: Prominent lung markings appear to be chronic. No evidence for airspace disease or frank pulmonary edema. Stable appearance of the heart and mediastinum. Trachea is midline.  IMPRESSION: Chronic lung changes without acute findings.   Electronically Signed   By: Markus Daft M.D.   On: 01/29/2014 15:40    1725:  Not orthostatic. Neuro exam remains intact and unchanged. Dx and testing d/w pt and family.  Questions answered.  Verb understanding, agreeable to admit. T/C to [facility]. Sarajane Jews, case discussed, including:  HPI, pertinent PM/SHx, VS/PE, dx testing, ED  course and treatment:  Agreeable to observation admit, requests to write temporary orders, obtain tele bed to team 1.   Alfonzo Feller, DO 01/30/14 [phone]

## 2014-01-30 ENCOUNTER — Observation Stay (HOSPITAL_COMMUNITY): Payer: Medicare Other

## 2014-01-30 DIAGNOSIS — N390 Urinary tract infection, site not specified: Secondary | ICD-10-CM

## 2014-01-30 LAB — BASIC METABOLIC PANEL
BUN: 18 mg/dL (ref 6–23)
CALCIUM: 10 mg/dL (ref 8.4–10.5)
CO2: 25 meq/L (ref 19–32)
Chloride: 109 mEq/L (ref 96–112)
Creatinine, Ser: 0.96 mg/dL (ref 0.50–1.10)
GFR calc Af Amer: 57 mL/min — ABNORMAL LOW (ref >90)
GFR calc non Af Amer: 49 mL/min — ABNORMAL LOW (ref >90)
GLUCOSE: 103 mg/dL — AB (ref 70–99)
Potassium: 4 mEq/L (ref 3.7–5.3)
Sodium: 144 mEq/L (ref 137–147)

## 2014-01-30 LAB — URINALYSIS, ROUTINE W REFLEX MICROSCOPIC
Bilirubin Urine: NEGATIVE
GLUCOSE, UA: NEGATIVE mg/dL
HGB URINE DIPSTICK: NEGATIVE
Ketones, ur: NEGATIVE mg/dL
Nitrite: POSITIVE — AB
PROTEIN: NEGATIVE mg/dL
SPECIFIC GRAVITY, URINE: 1.01 (ref 1.005–1.030)
Urobilinogen, UA: 0.2 mg/dL (ref 0.0–1.0)
pH: 7 (ref 5.0–8.0)

## 2014-01-30 LAB — URINE MICROSCOPIC-ADD ON

## 2014-01-30 MED ORDER — CEPHALEXIN 500 MG PO CAPS
500.0000 mg | ORAL_CAPSULE | Freq: Two times a day (BID) | ORAL | Status: DC
  Filled 2014-01-30 (×5): qty 1

## 2014-01-30 MED ORDER — CEPHALEXIN 500 MG PO CAPS: 500.0000 mg | ORAL_CAPSULE | Freq: Two times a day (BID) | ORAL | Status: DC

## 2014-01-30 NOTE — Progress Notes (Signed)
  PROGRESS NOTE  [name] [name] UXN:[id number] DOB: Oct 01, 1918 DOA: 01/29/2014 PCP: Orpah Greek, MD  Summary: 80 year old woman presented emergency department today with history of syncope and possible weakness of the hand and leg. Initial evaluation was unremarkable, no evidence of focal deficit and the patient was referred for observation.  Assessment/Plan: 1. Syncope. Most likely vasovagal phenomenon. No orthostasis. Telemetry unremarkable. No focal deficits, MRI of the brain unremarkable. No complaints today. Did well with physical therapy. 2. UTI. 3. Dementia. Appears to be at baseline.   Discharge home today with home health physical therapy.  Discussed with son at bedside  Murray Hodgkins, MD  [facility]  Pager [phone] If 7PM-7AM, please contact night-coverage at [email or web address], password [facility] 01/30/2014, 12:04 PM  LOS: 1 day   Consultants:  Physical therapy: Home health.  Procedures:    HPI/Subjective: No issues overnight. No complaints. No visual complaints. No focal neurologic complaints.  Objective: Filed Vitals:   01/30/14 0930 01/30/14 0950 01/30/14 1000 01/30/14 1134  BP:      Pulse:      Temp:    97.7 F (36.5 C)  TempSrc:    Axillary  Resp: 17 19 21    Weight:      SpO2:        Intake/Output Summary (Last 24 hours) at 01/30/14 1204 Last data filed at 01/30/14 0900  Gross per 24 hour  Intake    360 ml  Output     13 ml  Net    347 ml     Filed Weights   01/30/14 0500  Weight: 69.8 kg (153 lb 14.1 oz)    Exam:   Afebrile, vital signs are stable.  Gen. Appears calm and comfortable. Speech fluent and clear.  Cardiovascular regular rate and rhythm. No murmur, rub or gallop. No lower extremity edema.  Telemetry sinus rhythm.  Respiratory clear to auscultation bilaterally. No wheezes, rales or rhonchi. Normal respiratory effort.  Neurologic cranial nerves appear intact. No focal neurologic deficits. Grossly normal strength all  extremities.  Data Reviewed:  Basic metabolic panel unremarkable.  Repeat urinalysis grossly positive.  MRI of the brain unremarkable.  Scheduled Meds: . aspirin EC  81 mg Oral Daily  . celecoxib  200 mg Oral Daily  . citalopram  20 mg Oral Daily  . levothyroxine  50 mcg Oral QAC breakfast  . pantoprazole  40 mg Oral Daily  . simvastatin  20 mg Oral QHS  . sodium chloride  3 mL Intravenous Q12H  . sodium chloride  3 mL Intravenous Q12H  . topiramate  100 mg Oral QHS   Continuous Infusions:   Principal Problem:   Syncope Active Problems:   Unspecified hypothyroidism   Dementia   UTI (urinary tract infection)

## 2014-01-30 NOTE — Evaluation (Signed)
Physical Therapy Evaluation Patient Details Name: [name] [name] MRN: [id number] DOB: 29-Jul-1918 Today's Date: 01/30/2014 Time: 9371-6967 PT Time Calculation (min): 39 min  PT Assessment / Plan / Recommendation History of Present Illness  Pt is admitted after a probable vasovagal episode.  She had a hx of mild dementia and lives with her son and his wife.  She is normally independent with personal ADLs and ambulates with a cane, however I suspect that she uses her opposing hand to lean on furniture as well.  Clinical Impression   Pt is seen for evaluation.  Pt's immediate response to my presence was "I can't walk".  She was alert and cooperative, able to follow all directions.  Strength was antigravity and  symmetric with mild deconditioning. Her standing balance was poor and she tends to fall backward at times.  In a home setting, she probably does a lot of "furniture walking".  Currently, she requires a walker for gait stability and I would strongly recommend HHPT for work on improving standing balance.  Her BP remained stable throughout tx and she had no episodes of syncope.    PT Assessment  All further PT needs can be met in the next venue of care    Follow Up Recommendations  Home health PT               Equipment Recommendations  None recommended by PT             Precautions / Restrictions Precautions Precautions: Fall Restrictions Weight Bearing Restrictions: No         Mobility  Bed Mobility Overal bed mobility: Modified Independent Transfers Overall transfer level: Modified independent Equipment used: Rolling walker (2 wheeled) Ambulation/Gait Ambulation/Gait assistance: Min guard Ambulation Distance (Feet): 80 Feet Assistive device: Rolling walker (2 wheeled) Gait Pattern/deviations: WFL(Within Functional Limits) Gait velocity interpretation: Below normal speed for age/gender General Gait Details: initially upon standing and when stopping to rest, pt  occasionally leans backward.         PT Diagnosis: Difficulty walking;Generalized weakness  PT Problem List: Decreased balance;Decreased activity tolerance     Acute Rehab PT Goals PT Goal Formulation: No goals set, d/c therapy  Visit Information  Last PT Received On: 01/30/14 History of Present Illness: Pt is admitted after a probable vasovagal episode.  She had a hx of mild dementia and lives with her son and his wife.  She is normally independent with personal ADLs and ambulates with a cane, however I suspect that she uses her opposing hand to lean on furniture as well.       Prior [place] expects to be discharged to:: Private residence Living Arrangements: Children Available Help at Discharge: Family;Available 24 hours/day Type of Home: House Home Access: Stairs to enter [employer] of Steps: 1 Entrance Stairs-Rails: None Home Layout: One level Home Equipment: Walker - 2 wheels;Cane - single point Prior Function Level of Independence: Independent with assistive device(s) Communication Communication: No difficulties    Cognition  Cognition Arousal/Alertness: Awake/alert Behavior During Therapy: WFL for tasks assessed/performed Overall Cognitive Status: History of cognitive impairments - at baseline    Extremity/Trunk Assessment Lower Extremity Assessment Lower Extremity Assessment: Generalized weakness   Balance Balance Overall balance assessment: Needs assistance Sitting-balance support: No upper extremity supported;Feet supported Sitting balance-Leahy Scale: Good Postural control: Posterior lean Standing balance support: No upper extremity supported Standing balance-Leahy Scale: Poor  End of Session PT - End of Session Equipment Utilized During Treatment: Gait belt Activity  Tolerance: Patient tolerated treatment well Patient left: in chair;with call bell/phone within reach Nurse Communication: Mobility status   GP Functional Assessment Tool Used: clinical judgement Functional Limitation: Mobility: Walking and moving around Mobility: Walking and Moving Around Current Status ([id number]): At least 1 percent but less than 20 percent impaired, limited or restricted Mobility: Walking and Moving Around Goal Status [phone]): At least 1 percent but less than 20 percent impaired, limited or restricted Mobility: Walking and Moving Around Discharge Status [phone]): At least 1 percent but less than 20 percent impaired, limited or restricted   [name] [name] 01/30/2014, 10:56 AM

## 2014-01-30 NOTE — Progress Notes (Signed)
PT AND HER SON THAT SHE LIVES W/ HAVE DENIED NEED FOR HH P.T. SERVICES. DISCHARGE INSTRUCTION GIVEN TO PT AND HER SON AFTER THEY TALKED W/ DR [place]stone.

## 2014-01-30 NOTE — Discharge Summary (Signed)
Physician Discharge Summary  [name] [name] HCW:[id number] DOB: 1918/09/30 DOA: 01/29/2014  PCP: Orpah Greek, MD  Admit date: 01/29/2014 Discharge date: 01/30/2014  Recommendations for Outpatient Follow-up:  1. Syncope, see discussion below. 2. UTI resolution. Followup culture results.  3. Home health physical therapy for gait training  Follow-up Information   Follow up with [facility], MD. Schedule an appointment as soon as possible for a visit in 1 week.   Specialty:  Internal Medicine   Contact information:   [address] [place] [address] [phone]      Discharge Diagnoses:  1. Syncope, likely vasovagal phenomenon 2. UTI 3. Dementia  Discharge Condition: Improved Disposition: Home with home health physical therapy  Diet recommendation: Regular  Filed Weights   01/30/14 0500  Weight: 69.8 kg (153 lb 14.1 oz)    History of present illness:  80 year old woman presented emergency department today with history of syncope and possible weakness of the hand and leg. Initial evaluation was unremarkable, no evidence of focal deficit and the patient was referred for observation.  Hospital Course:  Patient was observed overnight. She had no recurrent syncope, MRI of the brain was unremarkable, no neurologic symptoms or focal deficits, telemetry is unremarkable. She did well with physical therapy, home health recommended. Seen to be most likely vasovagal in nature. She was admitted for this last year as well. Echocardiogram at that time 06/2013 showed LVEF 60-65%. Grade 1 diastolic dysfunction. No significant valvular abnormalities. No recurrent echocardiogram suggested at this point. Of note UTI may have been contributing. Please note, although ED RN documentation suggested there was family concern for right hand and left leg weakness, son reported no concerns whatever of focal deficits and he was the one present at home with her.  1. Syncope. Most likely vasovagal  phenomenon. No orthostasis. Telemetry unremarkable. No focal deficits, MRI of the brain unremarkable. No complaints today. Did well with physical therapy. 2. UTI. Empiric Keflex, followup culture. 3. Dementia. Appears to be at baseline.  Consultants:  Physical therapy: Home health. Procedures:  None   Discharge Instructions  Discharge Orders   Future Orders Complete By Expires   Diet general  As directed    Discharge instructions  As directed    Comments:     Drink plenty of fluids. Continue to minimize caffeine intake. Take your time, make slow positional changes. If you feel dizzy, sit or lie down immediately. Call your physician or seek immediate medical attention for weakness, passing out or worsening of condition.   Increase activity slowly  As directed        Medication List         aspirin EC 81 MG tablet  Take 81 mg by mouth daily.     CALCIUM 600 + D PO  Take 1 tablet by mouth daily.     celecoxib 200 MG capsule  Commonly known as:  CELEBREX  Take 200 mg by mouth daily.     cephALEXin 500 MG capsule  Commonly known as:  KEFLEX  Take 1 capsule (500 mg total) by mouth 2 (two) times daily.     citalopram 20 MG tablet  Commonly known as:  CELEXA  Take 20 mg by mouth daily.     HYDROcodone-acetaminophen 10-325 MG per tablet  Commonly known as:  NORCO  Take 1 tablet by mouth every 6 (six) hours as needed for moderate pain.     levothyroxine 50 MCG tablet  Commonly known as:  SYNTHROID, LEVOTHROID  Take 50  mcg by mouth daily before breakfast.     omeprazole 20 MG capsule  Commonly known as:  PRILOSEC  Take 20 mg by mouth daily before breakfast.     simvastatin 20 MG tablet  Commonly known as:  ZOCOR  Take 20 mg by mouth at bedtime.     topiramate 100 MG tablet  Commonly known as:  TOPAMAX  Take 100 mg by mouth at bedtime.     Vitamin B-12 2500 MCG Subl  Place 1 tablet under the tongue daily.     vitamin C 500 MG tablet  Commonly known as:  ASCORBIC  ACID  Take 500 mg by mouth daily.     vitamin E 200 UNIT capsule  Take 200 Units by mouth daily.       Allergies  Allergen Reactions  . Feldene [Piroxicam] Rash    The results of significant diagnostics from this hospitalization (including imaging, microbiology, ancillary and laboratory) are listed below for reference.    Significant Diagnostic Studies: Ct Head Wo Contrast  01/29/2014   CLINICAL DATA:  Hypertension. Dementia. Right hand and left leg weakness. Difficulty ambulating.  EXAM: CT HEAD WITHOUT CONTRAST  TECHNIQUE: Contiguous axial images were obtained from the base of the skull through the vertex without intravenous contrast.  COMPARISON:  None.  FINDINGS: The brainstem, cerebellum, cerebral peduncles, thalamus, basal ganglia, basilar cisterns, and ventricular system appear within normal limits. No intracranial hemorrhage, mass lesion, or acute CVA. There is atherosclerotic calcification of the cavernous carotid arteries bilaterally.  IMPRESSION: 1. No acute intracranial findings to explain the patient's symptoms.   Electronically Signed   By: Sherryl Barters M.D.   On: 01/29/2014 16:23   Mr Brain Wo Contrast  01/30/2014   CLINICAL DATA:  Altered mental status.  Weakness.  Headache.  EXAM: MRI HEAD WITHOUT CONTRAST  TECHNIQUE: Multiplanar, multiecho pulse sequences of the brain and surrounding structures were obtained without intravenous contrast.  COMPARISON:  Head CT 01/29/2014  FINDINGS: Diffusion imaging does not show any acute or subacute infarction. The brain shows generalized age related atrophy. There chronic small vessel changes within the pons. No cerebellar insult. The cerebral hemispheres show mild chronic small-vessel change of the white matter, less than often seen at this age. No cortical or large vessel territory infarction. No mass lesion, hemorrhage, hydrocephalus or extra-axial collection. No pituitary mass. No fluid in the sinuses, middle ears or mastoids.   IMPRESSION: No acute or reversible finding. Mild age related atrophy and chronic small vessel disease.   Electronically Signed   By: Nelson Chimes M.D.   On: 01/30/2014 09:24   Dg Chest Portable 1 View  01/29/2014   CLINICAL DATA:  Blurred vision.  EXAM: PORTABLE CHEST - 1 VIEW  COMPARISON:  06/30/2013  FINDINGS: Prominent lung markings appear to be chronic. No evidence for airspace disease or frank pulmonary edema. Stable appearance of the heart and mediastinum. Trachea is midline.  IMPRESSION: Chronic lung changes without acute findings.   Electronically Signed   By: Markus Daft M.D.   On: 01/29/2014 15:40    Microbiology: Recent Results (from the past 240 hour(s))  MRSA PCR SCREENING     Status: None   Collection Time    01/29/14  8:00 PM      Result Value Ref Range Status   MRSA by PCR NEGATIVE  NEGATIVE Final   Comment:            The GeneXpert MRSA Assay (FDA     approved  for NASAL specimens     only), is one component of a     comprehensive MRSA colonization     surveillance program. It is not     intended to diagnose MRSA     infection nor to guide or     monitor treatment for     MRSA infections.     Labs: Basic Metabolic Panel:  Recent Labs Lab 01/29/14 1443 01/29/14 1511 01/30/14 0433  NA 141 142 144  K 4.4 4.2 4.0  CL 107 106 109  CO2 23  --  25  GLUCOSE 102* 95 103*  BUN 18 18 18   CREATININE 0.96 1.00 0.96  CALCIUM 10.3  --  10.0   Liver Function Tests:  Recent Labs Lab 01/29/14 1443  AST 17  ALT 10  ALKPHOS 65  BILITOT 0.3  PROT 6.6  ALBUMIN 3.9   CBC:  Recent Labs Lab 01/29/14 1443 01/29/14 1511  WBC 7.5  --   NEUTROABS 4.6  --   HGB 13.5 13.9  HCT 38.6 41.0  MCV 96.5  --   PLT 181  --     Principal Problem:   Syncope Active Problems:   Unspecified hypothyroidism   Dementia   UTI (urinary tract infection)   Time coordinating discharge: 35 minutes  Signed:  Murray Hodgkins, MD [facility] 01/30/2014, 12:19 PM

## 2014-02-01 LAB — URINE CULTURE: Colony Count: >=100000

## 2014-08-14 ENCOUNTER — Emergency Department (HOSPITAL_COMMUNITY): Payer: Medicare Other

## 2014-08-14 ENCOUNTER — Emergency Department (HOSPITAL_COMMUNITY)
Admission: EM | Admit: 2014-08-14 | Discharge: 2014-08-14 | Disposition: A | Discharge status: Home / Self Care | Payer: Medicare Other | Attending: Emergency Medicine | Admitting: Emergency Medicine

## 2014-08-14 ENCOUNTER — Encounter (HOSPITAL_COMMUNITY): Payer: Self-pay | Admitting: Emergency Medicine

## 2014-08-14 DIAGNOSIS — F1110 Opioid abuse, uncomplicated: Secondary | ICD-10-CM | POA: Diagnosis not present

## 2014-08-14 DIAGNOSIS — I10 Essential (primary) hypertension: Secondary | ICD-10-CM | POA: Insufficient documentation

## 2014-08-14 DIAGNOSIS — H02409 Unspecified ptosis of unspecified eyelid: Secondary | ICD-10-CM | POA: Diagnosis not present

## 2014-08-14 DIAGNOSIS — I2510 Atherosclerotic heart disease of native coronary artery without angina pectoris: Secondary | ICD-10-CM | POA: Diagnosis not present

## 2014-08-14 DIAGNOSIS — R5383 Other fatigue: Principal | ICD-10-CM

## 2014-08-14 DIAGNOSIS — R531 Weakness: Secondary | ICD-10-CM

## 2014-08-14 DIAGNOSIS — IMO0002 Reserved for concepts with insufficient information to code with codable children: Secondary | ICD-10-CM | POA: Insufficient documentation

## 2014-08-14 DIAGNOSIS — Z872 Personal history of diseases of the skin and subcutaneous tissue: Secondary | ICD-10-CM | POA: Diagnosis not present

## 2014-08-14 DIAGNOSIS — Z79899 Other long term (current) drug therapy: Secondary | ICD-10-CM | POA: Insufficient documentation

## 2014-08-14 DIAGNOSIS — R5381 Other malaise: Secondary | ICD-10-CM | POA: Diagnosis not present

## 2014-08-14 DIAGNOSIS — E039 Hypothyroidism, unspecified: Secondary | ICD-10-CM | POA: Insufficient documentation

## 2014-08-14 DIAGNOSIS — Z85828 Personal history of other malignant neoplasm of skin: Secondary | ICD-10-CM | POA: Insufficient documentation

## 2014-08-14 DIAGNOSIS — R509 Fever, unspecified: Secondary | ICD-10-CM | POA: Diagnosis not present

## 2014-08-14 DIAGNOSIS — G8929 Other chronic pain: Secondary | ICD-10-CM | POA: Insufficient documentation

## 2014-08-14 DIAGNOSIS — F0390 Unspecified dementia without behavioral disturbance: Secondary | ICD-10-CM | POA: Diagnosis not present

## 2014-08-14 HISTORY — DX: Malignant (primary) neoplasm, unspecified: C80.1

## 2014-08-14 LAB — PROTIME-INR
INR: 0.99 (ref 0.00–1.49)
Prothrombin Time: 13.1 seconds (ref 11.6–15.2)

## 2014-08-14 LAB — BASIC METABOLIC PANEL
Anion gap: 11 (ref 5–15)
BUN: 19 mg/dL (ref 6–23)
CO2: 24 mEq/L (ref 19–32)
Calcium: 10.2 mg/dL (ref 8.4–10.5)
Chloride: 107 mEq/L (ref 96–112)
Creatinine, Ser: 0.9 mg/dL (ref 0.50–1.10)
GFR, EST AFRICAN AMERICAN: 61 mL/min — AB (ref >90)
GFR, EST NON AFRICAN AMERICAN: 53 mL/min — AB (ref >90)
GLUCOSE: 97 mg/dL (ref 70–99)
POTASSIUM: 4.2 meq/L (ref 3.7–5.3)
SODIUM: 142 meq/L (ref 137–147)

## 2014-08-14 LAB — I-STAT CHEM 8, ED
BUN: 20 mg/dL (ref 6–23)
CALCIUM ION: 1.37 mmol/L — AB (ref 1.13–1.30)
Chloride: 109 mEq/L (ref 96–112)
Creatinine, Ser: 1 mg/dL (ref 0.50–1.10)
Glucose, Bld: 97 mg/dL (ref 70–99)
HEMATOCRIT: 45 % (ref 36.0–46.0)
Hemoglobin: 15.3 g/dL — ABNORMAL HIGH (ref 12.0–15.0)
Potassium: 4 mEq/L (ref 3.7–5.3)
Sodium: 142 mEq/L (ref 137–147)
TCO2: 25 mmol/L (ref 0–100)

## 2014-08-14 LAB — CBC WITH DIFFERENTIAL/PLATELET
Basophils Absolute: 0.1 10*3/uL (ref 0.0–0.1)
Basophils Relative: 1 % (ref 0–1)
EOS ABS: 0.7 10*3/uL (ref 0.0–0.7)
Eosinophils Relative: 9 % — ABNORMAL HIGH (ref 0–5)
HCT: 42.7 % (ref 36.0–46.0)
Hemoglobin: 14.7 g/dL (ref 12.0–15.0)
Lymphocytes Relative: 34 % (ref 12–46)
Lymphs Abs: 2.9 10*3/uL (ref 0.7–4.0)
MCH: 32.7 pg (ref 26.0–34.0)
MCHC: 34.4 g/dL (ref 30.0–36.0)
MCV: 94.9 fL (ref 78.0–100.0)
Monocytes Absolute: 0.6 10*3/uL (ref 0.1–1.0)
Monocytes Relative: 7 % (ref 3–12)
NEUTROS PCT: 49 % (ref 43–77)
Neutro Abs: 4.2 10*3/uL (ref 1.7–7.7)
PLATELETS: 210 10*3/uL (ref 150–400)
RBC: 4.5 MIL/uL (ref 3.87–5.11)
RDW: 13.1 % (ref 11.5–15.5)
WBC: 8.5 10*3/uL (ref 4.0–10.5)

## 2014-08-14 LAB — CBG MONITORING, ED: Glucose-Capillary: 91 mg/dL (ref 70–99)

## 2014-08-14 LAB — URINE MICROSCOPIC-ADD ON

## 2014-08-14 LAB — RAPID URINE DRUG SCREEN, HOSP PERFORMED
Amphetamines: NOT DETECTED
BARBITURATES: NOT DETECTED
BENZODIAZEPINES: NOT DETECTED
Cocaine: NOT DETECTED
Opiates: POSITIVE — AB
Tetrahydrocannabinol: NOT DETECTED

## 2014-08-14 LAB — URINALYSIS, ROUTINE W REFLEX MICROSCOPIC
Bilirubin Urine: NEGATIVE
GLUCOSE, UA: NEGATIVE mg/dL
Ketones, ur: NEGATIVE mg/dL
LEUKOCYTES UA: NEGATIVE
Nitrite: NEGATIVE
PROTEIN: NEGATIVE mg/dL
Specific Gravity, Urine: 1.01 (ref 1.005–1.030)
UROBILINOGEN UA: 0.2 mg/dL (ref 0.0–1.0)
pH: 7.5 (ref 5.0–8.0)

## 2014-08-14 LAB — I-STAT TROPONIN, ED: TROPONIN I, POC: 0.01 ng/mL (ref 0.00–0.08)

## 2014-08-14 LAB — TROPONIN I

## 2014-08-14 LAB — APTT: aPTT: 28 seconds (ref 24–37)

## 2014-08-14 LAB — ETHANOL

## 2014-08-14 NOTE — Discharge Instructions (Signed)
Hypertension There is no evidence of stroke. Follow up with your doctor regarding your elevated blood pressure. Return to the ED if you develop chest pain, confusion, headache, weakness, numbness, tingling or any other concerns. Hypertension, commonly called high blood pressure, is when the force of blood pumping through your arteries is too strong. Your arteries are the blood vessels that carry blood from your heart throughout your body. A blood pressure reading consists of a higher number over a lower number, such as 110/72. The higher number (systolic) is the pressure inside your arteries when your heart pumps. The lower number (diastolic) is the pressure inside your arteries when your heart relaxes. Ideally you want your blood pressure below 120/80. Hypertension forces your heart to work harder to pump blood. Your arteries may become narrow or stiff. Having hypertension puts you at risk for heart disease, stroke, and other problems.  RISK FACTORS Some risk factors for high blood pressure are controllable. Others are not.  Risk factors you cannot control include:   Race. You may be at higher risk if you are African American.  Age. Risk increases with age.  Gender. Men are at higher risk than women before age 63 years. After age 39, women are at higher risk than men. Risk factors you can control include:  Not getting enough exercise or physical activity.  Being overweight.  Getting too much fat, sugar, calories, or salt in your diet.  Drinking too much alcohol. SIGNS AND SYMPTOMS Hypertension does not usually cause signs or symptoms. Extremely high blood pressure (hypertensive crisis) may cause headache, anxiety, shortness of breath, and nosebleed. DIAGNOSIS  To check if you have hypertension, your health care provider will measure your blood pressure while you are seated, with your arm held at the level of your heart. It should be measured at least twice using the same arm. Certain  conditions can cause a difference in blood pressure between your right and left arms. A blood pressure reading that is higher than normal on one occasion does not mean that you need treatment. If one blood pressure reading is high, ask your health care provider about having it checked again. TREATMENT  Treating high blood pressure includes making lifestyle changes and possibly taking medicine. Living a healthy lifestyle can help lower high blood pressure. You may need to change some of your habits. Lifestyle changes may include:  Following the DASH diet. This diet is high in fruits, vegetables, and whole grains. It is low in salt, red meat, and added sugars.  Getting at least 2 hours of brisk physical activity every week.  Losing weight if necessary.  Not smoking.  Limiting alcoholic beverages.  Learning ways to reduce stress. If lifestyle changes are not enough to get your blood pressure under control, your health care provider may prescribe medicine. You may need to take more than one. Work closely with your health care provider to understand the risks and benefits. HOME CARE INSTRUCTIONS  Have your blood pressure rechecked as directed by your health care provider.   Take medicines only as directed by your health care provider. Follow the directions carefully. Blood pressure medicines must be taken as prescribed. The medicine does not work as well when you skip doses. Skipping doses also puts you at risk for problems.   Do not smoke.   Monitor your blood pressure at home as directed by your health care provider. SEEK MEDICAL CARE IF:   You think you are having a reaction to medicines taken.  You have recurrent headaches or feel dizzy.  You have swelling in your ankles.  You have trouble with your vision. SEEK IMMEDIATE MEDICAL CARE IF:  You develop a severe headache or confusion.  You have unusual weakness, numbness, or feel faint.  You have severe chest or abdominal  pain.  You vomit repeatedly.  You have trouble breathing. MAKE SURE YOU:   Understand these instructions.  Will watch your condition.  Will get help right away if you are not doing well or get worse. Document Released: 11/10/2005 Document Revised: 03/27/2014 Document Reviewed: 09/02/2013 [facility] Patient Information 2015 [place], [place]. This information is not intended to replace advice given to you by your health care provider. Make sure you discuss any questions you have with your health care provider.

## 2014-08-14 NOTE — ED Notes (Signed)
Pt tolerated fluids well.  

## 2014-08-14 NOTE — ED Notes (Signed)
Pt c/o ha/weakness/dizziness x 1 week. Pt sent by pcp for htn and r/o cva.

## 2014-08-14 NOTE — ED Notes (Signed)
No complaints of dizziness when standing

## 2014-08-14 NOTE — ED Provider Notes (Signed)
CSN: [id number]     Arrival date & time 08/14/14  1237 History  This chart was scribed for [name] Essex, MD by Zola Button, ED Scribe. This patient was seen in room APA11/APA11 and the patient's care was started at 1:29 PM.     Chief Complaint  Patient presents with  . Cerebrovascular Accident    The history is provided by the patient and a relative. No language interpreter was used.   HPI Comments: [name] [name] is a 80 y.o. female with a hx of HTN, CAD who presents to the Emergency Department complaining of an episode of generalized weakness and disorientation that occurred about 1 week ago. Patient's family member states she yesterday stated she felt as though the episode last week was a stroke. She denies recent falls or LOC. She was seen by her PCP this morning who sent her to the ED for elevated BP. She notes associated generalized weakness since last week with a subjective fever. She normally uses a walker at baseline. Patient denies HA, CP, abdominal pain, change in appetite, dizziness, lightheadedness, numbness, tingling, coughing, rhinorrhea, or current pain.   Past Medical History  Diagnosis Date  . Hypertension   . Coronary artery disease   . Dementia   . Chronic ulcer of left leg   . Chronic back pain   . Chronic hip pain   . Urinary incontinence due to cognitive impairment   . Hypothyroidism   . Cancer     skin   Past Surgical History  Procedure Laterality Date  . Abdominal hysterectomy    . Lumbar back surgery     Family History  Problem Relation Age of Onset  . Cancer Sister    History  Substance Use Topics  . Smoking status: Never Smoker   . Smokeless tobacco: Not on file  . Alcohol Use: No   OB History   Grav Para Term Preterm Abortions TAB SAB Ect Mult Living                 Review of Systems See HPI. A complete 10 system review of systems was obtained and all systems are negative except as noted in the HPI and PMH.     Allergies   Feldene  Home Medications   Prior to Admission medications   Medication Sig Start Date End Date Taking? Authorizing Provider  Calcium Carbonate-Vitamin D (CALCIUM 600 + D PO) Take 1 tablet by mouth 2 (two) times daily.    Yes Historical Provider, MD  celecoxib (CELEBREX) 200 MG capsule Take 200 mg by mouth daily.   Yes Historical Provider, MD  Cyanocobalamin (VITAMIN B-12) 2500 MCG SUBL Place 1 tablet under the tongue daily.   Yes Historical Provider, MD  fludrocortisone (FLORINEF) 0.1 MG tablet Take 0.1 mg by mouth 3 (three) times a week.   Yes Historical Provider, MD  hydrOXYzine (ATARAX/VISTARIL) 25 MG tablet Take 25 mg by mouth every 8 (eight) hours as needed for itching.   Yes Historical Provider, MD  levothyroxine (SYNTHROID, LEVOTHROID) 50 MCG tablet Take 50 mcg by mouth daily before breakfast.   Yes Historical Provider, MD  omeprazole (PRILOSEC) 20 MG capsule Take 20 mg by mouth daily before breakfast.   Yes Historical Provider, MD  simvastatin (ZOCOR) 20 MG tablet Take 20 mg by mouth at bedtime.   Yes Historical Provider, MD  topiramate (TOPAMAX) 100 MG tablet Take 100 mg by mouth 2 (two) times daily.    Yes Historical Provider, MD  vitamin C (  ASCORBIC ACID) 500 MG tablet Take 500 mg by mouth daily.   Yes Historical Provider, MD   BP 187/89  Pulse 74  Temp(Src) 97.9 F (36.6 C) (Oral)  Resp 18  Ht 5\' 2"  (1.575 m)  Wt 153 lb (69.4 kg)  BMI 27.98 kg/m2  SpO2 98% Physical Exam  Nursing note and vitals reviewed. Constitutional: She is oriented to person, place, and time. She appears well-developed and well-nourished. No distress.  HENT:  Head: Normocephalic and atraumatic.  Mouth/Throat: Oropharynx is clear and moist. No oropharyngeal exudate.  Eyes: Conjunctivae and EOM are normal. Pupils are equal, round, and reactive to light.  Neck: Normal range of motion. Neck supple.  No meningismus.  Cardiovascular: Normal rate, regular rhythm, normal heart sounds and intact distal  pulses.   No murmur heard. Pulmonary/Chest: Effort normal and breath sounds normal. No respiratory distress.  Abdominal: Soft. There is no tenderness. There is no rebound and no guarding.  Musculoskeletal: Normal range of motion. She exhibits no edema and no tenderness.  Neurological: She is alert and oriented to person, place, and time. No cranial nerve deficit. She exhibits normal muscle tone. Coordination normal.  Poor effort throughout. No ataxia on finger to nose bilaterally. No pronator drift. 4/5 strength throughout. CN 2-12 intact. L eye ptosis at baseline. Equal grip strength. Sensation intact.   Skin: Skin is warm.  Psychiatric: She has a normal mood and affect. Her behavior is normal.    ED Course  Procedures DIAGNOSTIC STUDIES: Oxygen Saturation is 96% on RA, adequate by my interpretation.    COORDINATION OF CARE: 1:48 PM-Discussed treatment plan which includes labs, MR brain, CT head, EKG and CXR with pt at bedside and pt agreed to plan.   Labs Review Labs Reviewed  CBC WITH DIFFERENTIAL - Abnormal; Notable for the following:    Eosinophils Relative 9 (*)    All other components within normal limits  BASIC METABOLIC PANEL - Abnormal; Notable for the following:    GFR calc non Af Amer 53 (*)    GFR calc Af Amer 61 (*)    All other components within normal limits  URINALYSIS, ROUTINE W REFLEX MICROSCOPIC - Abnormal; Notable for the following:    Color, Urine STRAW (*)    Hgb urine dipstick SMALL (*)    All other components within normal limits  URINE RAPID DRUG SCREEN (HOSP PERFORMED) - Abnormal; Notable for the following:    Opiates POSITIVE (*)    All other components within normal limits  URINE MICROSCOPIC-ADD ON - Abnormal; Notable for the following:    Squamous Epithelial / LPF FEW (*)    All other components within normal limits  I-STAT CHEM 8, ED - Abnormal; Notable for the following:    Calcium, Ion 1.37 (*)    Hemoglobin 15.3 (*)    All other components  within normal limits  ETHANOL  PROTIME-INR  APTT  TROPONIN I  I-STAT TROPOININ, ED  I-STAT TROPOININ, ED  CBG MONITORING, ED    Imaging Review Dg Chest 2 View  08/14/2014   CLINICAL DATA:  CEREBROVASCULAR ACCIDENT  EXAM: CHEST  2 VIEW  COMPARISON:  01/29/2014  FINDINGS: The heart size and mediastinal contours are within normal limits. Both lungs are clear. The visualized skeletal structures are unremarkable.  IMPRESSION: No active cardiopulmonary disease.   Electronically Signed   By: Kathreen Devoid   On: 08/14/2014 14:03   Ct Head Wo Contrast  08/14/2014   CLINICAL DATA:  Left-sided weakness for  1 week  EXAM: CT HEAD WITHOUT CONTRAST  TECHNIQUE: Contiguous axial images were obtained from the base of the skull through the vertex without intravenous contrast.  COMPARISON:  MRI brain 01/30/2014  FINDINGS: There is no evidence of mass effect, midline shift, or extra-axial fluid collections. There is no evidence of a space-occupying lesion or intracranial hemorrhage. There is no evidence of a cortical-based area of acute infarction. There is generalized cerebral atrophy. There is periventricular white matter low attenuation likely secondary to microangiopathy.  The ventricles and sulci are appropriate for the patient's age. The basal cisterns are patent.  Visualized portions of the orbits are unremarkable. The visualized portions of the paranasal sinuses and mastoid air cells are unremarkable. Cerebrovascular atherosclerotic calcifications are noted.  The osseous structures are unremarkable.  IMPRESSION: No acute intracranial pathology.   Electronically Signed   By: Kathreen Devoid   On: 08/14/2014 14:03   Mr Brain Wo Contrast  08/14/2014   CLINICAL DATA:  80 year old the female with weakness and dizziness. History of high blood pressure and dementia. Initial encounter.  EXAM: MRI HEAD WITHOUT CONTRAST  TECHNIQUE: Multiplanar, multiecho pulse sequences of the brain and surrounding structures were obtained  without intravenous contrast.  COMPARISON:  08/14/2014 CT.  01/30/2014 MR.  FINDINGS: No acute infarct.  No intracranial hemorrhage.  Mild to slightly moderate small vessel disease type changes.  Age related atrophy without hydrocephalus.  No intracranial mass lesion noted on this unenhanced exam.  Major intracranial vascular structures are patent and ectatic.  Cervical medullary junction, pituitary region, pineal region and orbital structures unremarkable.  IMPRESSION: No acute infarct.  Mild to slightly my small vessel disease type changes.  Please see above.   Electronically Signed   By: Chauncey Cruel M.D.   On: 08/14/2014 16:12     EKG Interpretation   Date/Time:  Monday August 14 2014 12:44:03 EDT Ventricular Rate:  74 PR Interval:  296 QRS Duration: 85 QT Interval:  452 QTC Calculation: 501 R Axis:   8 Text Interpretation:  Sinus rhythm Prolonged PR interval Low voltage,  precordial leads Prolonged QT interval No significant change was found  Confirmed by [place] [phone]) on 08/14/2014 4:32:37 PM      MDM   Final diagnoses:  Essential hypertension  Weakness   patient from PCPs office with one week history of generalized weakness and hypertension. Patient denies any headache or dizziness currently. No focal weakness, numbness or tingling. Son reports patient thought she might have a stroke last week.  Patient is hypertensive. No focal neurological deficits. She's able ambulatory with her cane which is baseline.  CT head is negative for hemorrhage. EKG is unchanged. UA negative. CXR negative. Labs at baseline. No blood pressure medications on patient's medication list. MRI is negative for stroke. No chest pain or shortness of breath.  Patient able to ambulate with a walker which is her baseline. Troponin negative x2. Denies any chest pain, shortness of breath, weakness, numbness or tingling.  BP improved to 187/89.  She denies CP, headache, dizziness,  lightheadness.  Vision change.  Patient likely needs to be on blood pressure medication. We'll not start at this time. Discussed with the patient's son who will call Dr. tomorrow to be seen. Patient is asymptomatic now and wishes to go home. Previous admission shows elevated blood pressures in the 170-180 range.  BP 187/89  Pulse 74  Temp(Src) 97.9 F (36.6 C) (Oral)  Resp 18  Ht 5\' 2"  (1.575 m)  Wt 153 lb (69.4 kg)  BMI 27.98 kg/m2  SpO2 98%   I personally performed the services described in this documentation, which was scribed in my presence. The recorded information has been reviewed and is accurate.   [name] Essex, MD 08/14/14 2329

## 2014-08-14 NOTE — ED Notes (Signed)
PT has c/o headache, dizziness, generalized weakness x1 week. PT was seen at PCP this am and was sent to ED to r/o stroke and d/t HTN. PT denies dizziness at this time.

## 2014-08-14 NOTE — ED Notes (Signed)
Pt ambulated well with cane.

## 2015-06-03 ENCOUNTER — Encounter (HOSPITAL_COMMUNITY): Payer: Self-pay | Admitting: Emergency Medicine

## 2015-06-03 ENCOUNTER — Emergency Department (HOSPITAL_COMMUNITY)
Admission: EM | Admit: 2015-06-03 | Discharge: 2015-06-03 | Disposition: A | Discharge status: Home / Self Care | Payer: Medicare Other | Attending: Emergency Medicine | Admitting: Emergency Medicine

## 2015-06-03 ENCOUNTER — Emergency Department (HOSPITAL_COMMUNITY): Payer: Medicare Other

## 2015-06-03 DIAGNOSIS — Z85828 Personal history of other malignant neoplasm of skin: Secondary | ICD-10-CM | POA: Insufficient documentation

## 2015-06-03 DIAGNOSIS — I2510 Atherosclerotic heart disease of native coronary artery without angina pectoris: Secondary | ICD-10-CM | POA: Diagnosis not present

## 2015-06-03 DIAGNOSIS — E039 Hypothyroidism, unspecified: Secondary | ICD-10-CM | POA: Diagnosis not present

## 2015-06-03 DIAGNOSIS — G8929 Other chronic pain: Secondary | ICD-10-CM | POA: Insufficient documentation

## 2015-06-03 DIAGNOSIS — R42 Dizziness and giddiness: Secondary | ICD-10-CM | POA: Diagnosis not present

## 2015-06-03 DIAGNOSIS — I10 Essential (primary) hypertension: Secondary | ICD-10-CM | POA: Insufficient documentation

## 2015-06-03 DIAGNOSIS — Z872 Personal history of diseases of the skin and subcutaneous tissue: Secondary | ICD-10-CM | POA: Diagnosis not present

## 2015-06-03 DIAGNOSIS — G40909 Epilepsy, unspecified, not intractable, without status epilepticus: Secondary | ICD-10-CM | POA: Insufficient documentation

## 2015-06-03 DIAGNOSIS — Z79899 Other long term (current) drug therapy: Secondary | ICD-10-CM | POA: Diagnosis not present

## 2015-06-03 DIAGNOSIS — R55 Syncope and collapse: Secondary | ICD-10-CM | POA: Diagnosis not present

## 2015-06-03 DIAGNOSIS — R569 Unspecified convulsions: Secondary | ICD-10-CM | POA: Diagnosis present

## 2015-06-03 HISTORY — DX: Unspecified convulsions: R56.9

## 2015-06-03 LAB — BASIC METABOLIC PANEL
Anion gap: 7 (ref 5–15)
BUN: 14 mg/dL (ref 6–20)
CO2: 21 mmol/L — AB (ref 22–32)
CREATININE: 0.81 mg/dL (ref 0.44–1.00)
Calcium: 9.1 mg/dL (ref 8.9–10.3)
Chloride: 114 mmol/L — ABNORMAL HIGH (ref 101–111)
GFR calc Af Amer: >60 mL/min (ref >60)
GFR calc non Af Amer: 60 mL/min — ABNORMAL LOW (ref >60)
Glucose, Bld: 105 mg/dL — ABNORMAL HIGH (ref 65–99)
POTASSIUM: 3.7 mmol/L (ref 3.5–5.1)
Sodium: 142 mmol/L (ref 135–145)

## 2015-06-03 LAB — CBC WITH DIFFERENTIAL/PLATELET
Basophils Absolute: 0.1 10*3/uL (ref 0.0–0.1)
Basophils Relative: 1 % (ref 0–1)
Eosinophils Absolute: 0.4 10*3/uL (ref 0.0–0.7)
Eosinophils Relative: 5 % (ref 0–5)
HEMATOCRIT: 41.2 % (ref 36.0–46.0)
Hemoglobin: 13.9 g/dL (ref 12.0–15.0)
LYMPHS ABS: 2.1 10*3/uL (ref 0.7–4.0)
Lymphocytes Relative: 28 % (ref 12–46)
MCH: 32.8 pg (ref 26.0–34.0)
MCHC: 33.7 g/dL (ref 30.0–36.0)
MCV: 97.2 fL (ref 78.0–100.0)
MONO ABS: 0.6 10*3/uL (ref 0.1–1.0)
Monocytes Relative: 8 % (ref 3–12)
Neutro Abs: 4.4 10*3/uL (ref 1.7–7.7)
Neutrophils Relative %: 58 % (ref 43–77)
Platelets: 170 10*3/uL (ref 150–400)
RBC: 4.24 MIL/uL (ref 3.87–5.11)
RDW: 12.9 % (ref 11.5–15.5)
WBC: 7.5 10*3/uL (ref 4.0–10.5)

## 2015-06-03 LAB — URINALYSIS, ROUTINE W REFLEX MICROSCOPIC
BILIRUBIN URINE: NEGATIVE
Glucose, UA: NEGATIVE mg/dL
Hgb urine dipstick: NEGATIVE
Ketones, ur: NEGATIVE mg/dL
LEUKOCYTES UA: NEGATIVE
NITRITE: NEGATIVE
PROTEIN: NEGATIVE mg/dL
Specific Gravity, Urine: 1.015 (ref 1.005–1.030)
UROBILINOGEN UA: 0.2 mg/dL (ref 0.0–1.0)
pH: 7 (ref 5.0–8.0)

## 2015-06-03 MED ORDER — TOPIRAMATE 100 MG PO TABS
100.0000 mg | ORAL_TABLET | Freq: Once | ORAL | Status: AC
  Administered 2015-06-03: 100 mg via ORAL
  Filled 2015-06-03: qty 1

## 2015-06-03 MED ORDER — TOPIRAMATE 100 MG PO TABS: 100.0000 mg | ORAL_TABLET | Freq: Two times a day (BID) | ORAL | Status: DC

## 2015-06-03 MED ORDER — SODIUM CHLORIDE 0.9 % IV BOLUS (SEPSIS)
500.0000 mL | Freq: Once | INTRAVENOUS | Status: AC
  Administered 2015-06-03: 500 mL via INTRAVENOUS

## 2015-06-03 MED ORDER — MECLIZINE HCL 50 MG PO TABS: 25.0000 mg | ORAL_TABLET | Freq: Two times a day (BID) | ORAL | Status: DC | PRN

## 2015-06-03 MED ORDER — MECLIZINE HCL 12.5 MG PO TABS
25.0000 mg | ORAL_TABLET | Freq: Once | ORAL | Status: AC
  Administered 2015-06-03: 25 mg via ORAL
  Filled 2015-06-03: qty 2

## 2015-06-03 NOTE — ED Notes (Signed)
Patient with no complaints at this time. Respirations even and unlabored. Skin warm/dry. Discharge instructions reviewed with patient at this time. Patient given opportunity to voice concerns/ask questions. IV removed per policy and band-aid applied to site. Patient discharged at this time and left Emergency Department with steady gait.  

## 2015-06-03 NOTE — Discharge Instructions (Signed)
Benign Positional Vertigo Vertigo means you feel like you or your surroundings are moving when they are not. Benign positional vertigo is the most common form of vertigo. Benign means that the cause of your condition is not serious. Benign positional vertigo is more common in older adults. CAUSES  Benign positional vertigo is the result of an upset in the labyrinth system. This is an area in the middle ear that helps control your balance. This may be caused by a viral infection, head injury, or repetitive motion. However, often no specific cause is found. SYMPTOMS  Symptoms of benign positional vertigo occur when you move your head or eyes in different directions. Some of the symptoms may include:  Loss of balance and falls.  Vomiting.  Blurred vision.  Dizziness.  Nausea.  Involuntary eye movements (nystagmus). DIAGNOSIS  Benign positional vertigo is usually diagnosed by physical exam. If the specific cause of your benign positional vertigo is unknown, your caregiver may perform imaging tests, such as magnetic resonance imaging (MRI) or computed tomography (CT). TREATMENT  Your caregiver may recommend movements or procedures to correct the benign positional vertigo. Medicines such as meclizine, benzodiazepines, and medicines for nausea may be used to treat your symptoms. In rare cases, if your symptoms are caused by certain conditions that affect the inner ear, you may need surgery. HOME CARE INSTRUCTIONS   Follow your caregiver's instructions.  Move slowly. Do not make sudden body or head movements.  Avoid driving.  Avoid operating heavy machinery.  Avoid performing any tasks that would be dangerous to you or others during a vertigo episode.  Drink enough fluids to keep your urine clear or pale yellow. SEEK IMMEDIATE MEDICAL CARE IF:   You develop problems with walking, weakness, numbness, or using your arms, hands, or legs.  You have difficulty speaking.  You develop  severe headaches.  Your nausea or vomiting continues or gets worse.  You develop visual changes.  Your family or friends notice any behavioral changes.  Your condition gets worse.  You have a fever.  You develop a stiff neck or sensitivity to light. MAKE SURE YOU:   Understand these instructions.  Will watch your condition.  Will get help right away if you are not doing well or get worse. Document Released: 08/18/2006 Document Revised: 02/02/2012 Document Reviewed: 07/31/2011 [facility] Patient Information 2015 [place], [place]. This information is not intended to replace advice given to you by your health care provider. Make sure you discuss any questions you have with your health care provider.   Medication for dizziness. Follow-up your primary care doctor.

## 2015-06-03 NOTE — ED Notes (Signed)
Per EMS called out for seizure activity- per report pt ran out of her topamax yesterday - Had seizure around 11am - full body shaking . Not witness by EMS

## 2015-06-03 NOTE — ED Provider Notes (Signed)
CSN: [id number]     Arrival date & time 06/03/15  1216 History  This chart was scribed for Nat Christen, MD by Starleen Arms, ED Scribe. This patient was seen in room APA05/APA05 and the patient's care was started at 12:35 PM.   Chief Complaint  Patient presents with  . Seizures  LEVEL 5 CAVEAT (Dementia) The history is provided by the patient and a relative. No language interpreter was used.   HPI Comments: [name] [name] is a 81 y.o. female who presents to the Emergency Department complaining of dizziness worse with standing and associated tremulousness, leg weakness.  The son reports he went to the patient's home this morning and found her sitting up in bend stating "my legs won't work."  With multiple assisted attempts, the patient was unable to stand.  The son reports a previous episode of similar symptoms surrounding her tremulousness.  She is prescribed Topomax 100 mg twice daily, which she takes regularly, but has missed last night's and this morning's dose.  She denies CP, SOB.  Past Medical History  Diagnosis Date  . Hypertension   . Coronary artery disease   . Dementia   . Chronic ulcer of left leg   . Chronic back pain   . Chronic hip pain   . Urinary incontinence due to cognitive impairment   . Hypothyroidism   . Cancer     skin  . Seizures    Past Surgical History  Procedure Laterality Date  . Abdominal hysterectomy    . Lumbar back surgery     Family History  Problem Relation Age of Onset  . Cancer Sister    History  Substance Use Topics  . Smoking status: Never Smoker   . Smokeless tobacco: Not on file  . Alcohol Use: No   OB History    No data available     Review of Systems  Unable to perform ROS: Age  Neurological: Positive for seizures.      Allergies  Feldene  Home Medications   Prior to Admission medications   Medication Sig Start Date End Date Taking? Authorizing Provider  Calcium Carbonate-Vitamin D (CALCIUM 600 + D PO) Take 1 tablet by  mouth 2 (two) times daily.     Historical Provider, MD  celecoxib (CELEBREX) 200 MG capsule Take 200 mg by mouth daily.    Historical Provider, MD  Cyanocobalamin (VITAMIN B-12) 2500 MCG SUBL Place 1 tablet under the tongue daily.    Historical Provider, MD  fludrocortisone (FLORINEF) 0.1 MG tablet Take 0.1 mg by mouth 3 (three) times a week.    Historical Provider, MD  HYDROcodone-acetaminophen (NORCO) 10-325 MG per tablet Take 1 tablet by mouth every 6 (six) hours as needed. For pain 05/04/15   Historical Provider, MD  hydrOXYzine (ATARAX/VISTARIL) 25 MG tablet Take 25 mg by mouth every 8 (eight) hours as needed for itching.    Historical Provider, MD  levocetirizine (XYZAL) 2.5 MG/5ML solution Take 5 mLs by mouth daily. 04/24/15   Historical Provider, MD  levothyroxine (SYNTHROID, LEVOTHROID) 50 MCG tablet Take 50 mcg by mouth daily before breakfast.    Historical Provider, MD  meclizine (ANTIVERT) 50 MG tablet Take 0.5 tablets (25 mg total) by mouth 2 (two) times daily as needed for dizziness or nausea. 06/03/15   Nat Christen, MD  neomycin-polymyxin b-dexamethasone (MAXITROL) 3.5-10000-0.1 OINT  03/29/15   Historical Provider, MD  omeprazole (PRILOSEC) 20 MG capsule Take 20 mg by mouth daily before breakfast.    Historical  Provider, MD  simvastatin (ZOCOR) 20 MG tablet Take 20 mg by mouth at bedtime.    Historical Provider, MD  topiramate (TOPAMAX) 100 MG tablet Take 100 mg by mouth 2 (two) times daily.     Historical Provider, MD  vitamin C (ASCORBIC ACID) 500 MG tablet Take 500 mg by mouth daily.    Historical Provider, MD   BP 187/93 mmHg  Pulse 93  Temp(Src) 97.8 F (36.6 C) (Oral)  Resp 14  SpO2 94% Physical Exam  Constitutional: She is oriented to person, place, and time. She appears well-developed and well-nourished.  HENT:  Head: Normocephalic and atraumatic.  Eyes: Conjunctivae and EOM are normal. Pupils are equal, round, and reactive to light.  Neck: Normal range of motion. Neck  supple.  Cardiovascular: Normal rate and regular rhythm.   Pulmonary/Chest: Effort normal and breath sounds normal.  Abdominal: Soft. Bowel sounds are normal.  Musculoskeletal: Normal range of motion.  Moving all extremities well.   Neurological: She is alert and oriented to person, place, and time.  Skin: Skin is warm and dry.  Psychiatric: She has a normal mood and affect. Her behavior is normal.  Nursing note and vitals reviewed.   ED Course  Procedures (including critical care time)  DIAGNOSTIC STUDIES: Oxygen Saturation is 96% on RA, normal by my interpretation.    COORDINATION OF CARE:  12:44 PM Will order labs, head CT and Topomax.  Patient acknowledges and agrees with plan.    Labs Review Labs Reviewed  BASIC METABOLIC PANEL - Abnormal; Notable for the following:    Chloride 114 (*)    CO2 21 (*)    Glucose, Bld 105 (*)    GFR calc non Af Amer 60 (*)    All other components within normal limits  CBC WITH DIFFERENTIAL/PLATELET  URINALYSIS, ROUTINE W REFLEX MICROSCOPIC (NOT AT [facility])    Imaging Review Ct Head Wo Contrast  06/03/2015   CLINICAL DATA:  Syncopal episode.  Dizziness.  EXAM: CT HEAD WITHOUT CONTRAST  TECHNIQUE: Contiguous axial images were obtained from the base of the skull through the vertex without intravenous contrast.  COMPARISON:  08/14/2014  FINDINGS: Stable age related cerebral atrophy, ventriculomegaly and periventricular white matter disease. No extra-axial fluid collections are identified. No CT findings for acute hemispheric infarction or intracranial hemorrhage. No mass lesions. The brainstem and cerebellum are normal.  No acute bony findings. The paranasal sinuses and mastoid air cells are clear.  IMPRESSION: Stable age related cerebral atrophy, ventriculomegaly and periventricular white matter disease. No acute intracranial findings or skull fracture.   Electronically Signed   By: Marijo Sanes M.D.   On: 06/03/2015 14:03     EKG  Interpretation   Date/Time:  Sunday June 03 2015 12:19:41 EDT Ventricular Rate:  85 PR Interval:  195 QRS Duration: 81 QT Interval:  388 QTC Calculation: 461 R Axis:   -4 Text Interpretation:  Sinus rhythm Confirmed by Lacinda Axon  MD, Johannes Everage ([id number]) on  06/03/2015 1:19:13 PM      MDM   Final diagnoses:  Vertigo  Syncope, unspecified syncope type    Patient is alert and oriented 3. She can move all extremities to command. CT head shows no acute findings. She feels better after meclizine 25 mg. Hemoglobin and glucose stable. Urinalysis shows no infection. Will discharge home with meclizine 25 mg prescription.  Discussed with patient and her family  I personally performed the services described in this documentation, which was scribed in my presence. The recorded  information has been reviewed and is accurate.    Nat Christen, MD 06/03/15 [phone]

## 2015-06-03 NOTE — ED Notes (Signed)
Pt refusing to get up to provide urine sample - kept saying " I can't get up , I can't stand " Family member at bedside stated that pt ha been co dizziness when she stands

## 2015-06-03 NOTE — ED Notes (Signed)
Call placed to pharmacy re- Topamax

## 2016-11-27 ENCOUNTER — Encounter (HOSPITAL_COMMUNITY): Payer: Self-pay | Admitting: Emergency Medicine

## 2016-11-27 ENCOUNTER — Emergency Department (HOSPITAL_COMMUNITY): Payer: Medicare Other

## 2016-11-27 ENCOUNTER — Emergency Department (HOSPITAL_COMMUNITY)
Admission: EM | Admit: 2016-11-27 | Discharge: 2016-11-27 | Disposition: A | Discharge status: Home / Self Care | Payer: Medicare Other | Attending: Emergency Medicine | Admitting: Emergency Medicine

## 2016-11-27 DIAGNOSIS — Z79899 Other long term (current) drug therapy: Secondary | ICD-10-CM | POA: Insufficient documentation

## 2016-11-27 DIAGNOSIS — I10 Essential (primary) hypertension: Secondary | ICD-10-CM | POA: Diagnosis not present

## 2016-11-27 DIAGNOSIS — E039 Hypothyroidism, unspecified: Secondary | ICD-10-CM | POA: Diagnosis not present

## 2016-11-27 DIAGNOSIS — Z85828 Personal history of other malignant neoplasm of skin: Secondary | ICD-10-CM | POA: Insufficient documentation

## 2016-11-27 DIAGNOSIS — I7143 Infrarenal abdominal aortic aneurysm, without rupture: Secondary | ICD-10-CM

## 2016-11-27 DIAGNOSIS — R1032 Left lower quadrant pain: Secondary | ICD-10-CM

## 2016-11-27 DIAGNOSIS — Z7982 Long term (current) use of aspirin: Secondary | ICD-10-CM | POA: Diagnosis not present

## 2016-11-27 DIAGNOSIS — I714 Abdominal aortic aneurysm, without rupture: Secondary | ICD-10-CM | POA: Diagnosis not present

## 2016-11-27 DIAGNOSIS — I2510 Atherosclerotic heart disease of native coronary artery without angina pectoris: Secondary | ICD-10-CM | POA: Diagnosis not present

## 2016-11-27 LAB — URINALYSIS, ROUTINE W REFLEX MICROSCOPIC
Bilirubin Urine: NEGATIVE
Glucose, UA: NEGATIVE mg/dL
Hgb urine dipstick: NEGATIVE
Ketones, ur: NEGATIVE mg/dL
NITRITE: NEGATIVE
PROTEIN: NEGATIVE mg/dL
Specific Gravity, Urine: 1.01 (ref 1.005–1.030)
pH: 6 (ref 5.0–8.0)

## 2016-11-27 LAB — COMPREHENSIVE METABOLIC PANEL
ALK PHOS: 64 U/L (ref 38–126)
ALT: 10 U/L — ABNORMAL LOW (ref 14–54)
AST: 18 U/L (ref 15–41)
Albumin: 4 g/dL (ref 3.5–5.0)
Anion gap: 4 — ABNORMAL LOW (ref 5–15)
BUN: 16 mg/dL (ref 6–20)
CALCIUM: 9.6 mg/dL (ref 8.9–10.3)
CO2: 24 mmol/L (ref 22–32)
Chloride: 110 mmol/L (ref 101–111)
Creatinine, Ser: 0.98 mg/dL (ref 0.44–1.00)
GFR calc non Af Amer: 47 mL/min — ABNORMAL LOW (ref >60)
GFR, EST AFRICAN AMERICAN: 55 mL/min — AB (ref >60)
Glucose, Bld: 101 mg/dL — ABNORMAL HIGH (ref 65–99)
Potassium: 4 mmol/L (ref 3.5–5.1)
Sodium: 138 mmol/L (ref 135–145)
TOTAL PROTEIN: 6.5 g/dL (ref 6.5–8.1)
Total Bilirubin: 0.4 mg/dL (ref 0.3–1.2)

## 2016-11-27 LAB — CBC WITH DIFFERENTIAL/PLATELET
BASOS ABS: 0.1 10*3/uL (ref 0.0–0.1)
BASOS PCT: 1 %
Eosinophils Absolute: 0.3 10*3/uL (ref 0.0–0.7)
Eosinophils Relative: 3 %
HEMATOCRIT: 40.7 % (ref 36.0–46.0)
Hemoglobin: 13.5 g/dL (ref 12.0–15.0)
Lymphocytes Relative: 34 %
Lymphs Abs: 3.2 10*3/uL (ref 0.7–4.0)
MCH: 33.1 pg (ref 26.0–34.0)
MCHC: 33.2 g/dL (ref 30.0–36.0)
MCV: 99.8 fL (ref 78.0–100.0)
MONO ABS: 0.7 10*3/uL (ref 0.1–1.0)
Monocytes Relative: 7 %
Neutro Abs: 5.1 10*3/uL (ref 1.7–7.7)
Neutrophils Relative %: 55 %
PLATELETS: 194 10*3/uL (ref 150–400)
RBC: 4.08 MIL/uL (ref 3.87–5.11)
RDW: 13.8 % (ref 11.5–15.5)
WBC: 9.3 10*3/uL (ref 4.0–10.5)

## 2016-11-27 LAB — URINALYSIS, MICROSCOPIC (REFLEX): RBC / HPF: NONE SEEN RBC/hpf (ref 0–5)

## 2016-11-27 MED ORDER — IOPAMIDOL (ISOVUE-300) INJECTION 61%
100.0000 mL | Freq: Once | INTRAVENOUS | Status: AC | PRN
  Administered 2016-11-27: 100 mL via INTRAVENOUS

## 2016-11-27 MED ORDER — SODIUM CHLORIDE 0.9 % IV BOLUS (SEPSIS)
1000.0000 mL | Freq: Once | INTRAVENOUS | Status: AC
  Administered 2016-11-27: 1000 mL via INTRAVENOUS

## 2016-11-27 NOTE — ED Triage Notes (Signed)
Over Holiday, pt complained to family that she was not voiding,  today pain in left side

## 2016-11-27 NOTE — ED Provider Notes (Signed)
[facility] DEPT Provider Note   CSN: [id number] Arrival date & time: 11/27/16  1046   By signing my name below, I, Hilbert Odor, attest that this documentation has been prepared under the direction and in the presence of Isla Pence, MD. Electronically Signed: Hilbert Odor, Scribe. 11/27/16. 11:14 AM. History   Chief Complaint Chief Complaint  Patient presents with  . Flank Pain     The history is provided by the patient and a relative. No language interpreter was used.  HPI Comments: [name] [name] is a 83 y.o. female who presents to the Emergency Department complaining of left sided flank pain. Per family: Patient has been eating well. They believe she has not been drinking enough water. She denies fever. She is currently living with her son. Past Medical History:  Diagnosis Date  . Cancer ([place])    skin  . Chronic back pain   . Chronic hip pain   . Chronic ulcer of left leg ([place])   . Coronary artery disease   . Dementia   . Hypertension   . Hypothyroidism   . Seizures ([place])   . Urinary incontinence due to cognitive impairment     Patient Active Problem List   Diagnosis Date Noted  . UTI (urinary tract infection) 01/30/2014  . Dementia 01/29/2014  . Syncope 07/01/2013  . Chronic ulcer of left leg ([place]) 07/01/2013  . Seizures ([place]) 07/01/2013  . Unspecified hypothyroidism 07/01/2013  . Eosinophilia 07/01/2013    Past Surgical History:  Procedure Laterality Date  . ABDOMINAL HYSTERECTOMY    . lumbar back surgery      OB History    No data available      Home Medications    Prior to Admission medications   Medication Sig Start Date End Date Taking? Authorizing Provider  aspirin EC 81 MG tablet Take 81 mg by mouth daily.   Yes Historical Provider, MD  Calcium Carbonate-Vitamin D (CALCIUM 600 + D PO) Take 1 tablet by mouth daily.    Yes Historical Provider, MD  Cyanocobalamin (VITAMIN B-12) 2500 MCG SUBL Place 1 tablet under the tongue daily.    Yes Historical Provider, MD  donepezil (ARICEPT) 5 MG tablet Take 5 mg by mouth at bedtime.   Yes Historical Provider, MD  fludrocortisone (FLORINEF) 0.1 MG tablet Take 0.1 mg by mouth daily.    Yes Historical Provider, MD  HYDROcodone-acetaminophen (NORCO) 10-325 MG per tablet Take 1 tablet by mouth every 6 (six) hours as needed for moderate pain. For pain 05/04/15  Yes Historical Provider, MD  levocetirizine (XYZAL) 2.5 MG/5ML solution Take 5 mLs by mouth every evening.  04/24/15  Yes Historical Provider, MD  levothyroxine (SYNTHROID, LEVOTHROID) 75 MCG tablet Take 75 mcg by mouth daily before breakfast.   Yes Historical Provider, MD  omeprazole (PRILOSEC) 20 MG capsule Take 20 mg by mouth daily before breakfast.   Yes Historical Provider, MD  simvastatin (ZOCOR) 20 MG tablet Take 20 mg by mouth at bedtime.   Yes Historical Provider, MD  topiramate (TOPAMAX) 100 MG tablet Take 1 tablet (100 mg total) by mouth 2 (two) times daily. 06/03/15  Yes Nat Christen, MD  vitamin C (ASCORBIC ACID) 500 MG tablet Take 500 mg by mouth daily.   Yes Historical Provider, MD    Family History Family History  Problem Relation Age of Onset  . Cancer Sister     Social History Social History  Substance Use Topics  . Smoking status: Never Smoker  . Smokeless tobacco:  Never Used  . Alcohol use No     Allergies   Feldene [piroxicam]   Review of Systems Review of Systems  Constitutional: Negative for fever.  Cardiovascular: Negative for chest pain.  Genitourinary: Positive for flank pain (Left sided).  All other systems reviewed and are negative.    Physical Exam Updated Vital Signs BP 109/74   Pulse 60   Temp 97.6 F (36.4 C) (Oral)   Resp 18   Ht 5\' 2"  (1.575 m)   Wt 150 lb (68 kg)   SpO2 97%   BMI 27.44 kg/m   Physical Exam  Constitutional: She is oriented to person, place, and time. She appears well-developed and well-nourished. No distress.  HENT:  Head: Normocephalic and atraumatic.    Right Ear: External ear normal.  Left Ear: External ear normal.  Nose: Nose normal.  Mouth/Throat: Oropharynx is clear and moist.  Eyes: Conjunctivae and EOM are normal. Pupils are equal, round, and reactive to light.  Neck: Normal range of motion.  Cardiovascular: Normal rate and regular rhythm.  Exam reveals no gallop and no friction rub.   No murmur heard. Pulmonary/Chest: Effort normal and breath sounds normal. She has no wheezes. She has no rales.  Abdominal: Soft. Bowel sounds are normal. There is tenderness.  Mild tenderness to LLQ.  Musculoskeletal: Normal range of motion.  Neurological: She is alert and oriented to person, place, and time.  Skin: Skin is warm.  Psychiatric: She has a normal mood and affect. Her behavior is normal. Judgment and thought content normal.  Nursing note and vitals reviewed.    ED Treatments / Results  DIAGNOSTIC STUDIES: Oxygen Saturation is 98% on RA, normal by my interpretation.    COORDINATION OF CARE: 11:14 AM Discussed treatment plan with pt at bedside and pt agreed to plan.  Labs (all labs ordered are listed, but only abnormal results are displayed) Labs Reviewed  COMPREHENSIVE METABOLIC PANEL - Abnormal; Notable for the following:       Result Value   Glucose, Bld 101 (*)    ALT 10 (*)    GFR calc non Af Amer 47 (*)    GFR calc Af Amer 55 (*)    Anion gap 4 (*)    All other components within normal limits  URINALYSIS, ROUTINE W REFLEX MICROSCOPIC - Abnormal; Notable for the following:    APPearance CLOUDY (*)    Leukocytes, UA SMALL (*)    All other components within normal limits  URINALYSIS, MICROSCOPIC (REFLEX) - Abnormal; Notable for the following:    Bacteria, UA FEW (*)    Squamous Epithelial / LPF 0-5 (*)    All other components within normal limits  CBC WITH DIFFERENTIAL/PLATELET    EKG  EKG Interpretation None       Radiology Ct Abdomen Pelvis W Contrast  Result Date: 11/27/2016 CLINICAL DATA:  Left flank  pain today, left lower quadrant pain. EXAM: CT ABDOMEN AND PELVIS WITH CONTRAST TECHNIQUE: Multidetector CT imaging of the abdomen and pelvis was performed using the standard protocol following bolus administration of intravenous contrast. CONTRAST:  142mL ISOVUE-300 IOPAMIDOL (ISOVUE-300) INJECTION 61% COMPARISON:  None FINDINGS: Lower chest: The lung bases are clear. There are diffuse coronary artery calcification present and the heart is within upper limits of normal. Hepatobiliary: The liver enhances with no focal abnormality and no ductal dilatation is seen. The gallbladder is slightly distended but no calcified gallstones are seen. Pancreas: The pancreas is relatively atrophic with fatty infiltration but no  mass is evident. Spleen: The spleen is within normal limits in size. Adrenals/Urinary Tract: The adrenal glands are unremarkable other than a probable incidental small right adrenal adenoma. The kidneys enhance with no calculus or mass. A probable small cyst is noted bilaterally. On delayed images, the pelvocaliceal systems are unremarkable. The ureters are normal in caliber. The urinary bladder is moderately urine distended with no abnormality noted. Stomach/Bowel: The stomach is largely decompressed and cannot be well evaluated. No small bowel distention is seen there is feces throughout the colon. The terminal ileum is unremarkable. The appendix is not visualized. Vascular/Lymphatic: The abdominal aorta is very ectatic. There is and infrarenal abdominal aortic aneurysm present of approximately 4.0 cm in maximum diameter. This aneurysm tapers to the iliac bifurcation. There is significant abdominal aortic atherosclerosis present as well. Reproductive: The uterus has previously been resected. No adnexal lesion is seen. There is a small amount of free fluid in the pelvis of questionable significance. Other: None Musculoskeletal: There is complete vertebra plana compression of L1 vertebral body with some  retropulsion at that level. No prior imaging studies are available to assess but this does appear to be and old process. Also there is anterolisthesis of L4 on L5 by approximately 11 mm most likely degenerative in origin. Degenerative disc disease is most marked at L4-5 but also present at L3-4 and L5-S1. IMPRESSION: 1. No definite explanation for the patient's left abdomen pain is seen. 2. Infrarenal abdominal aortic aneurysm of 4.0 cm maximum diameter. Recommend followup by ultrasound in 1 year. This recommendation follows ACR consensus guidelines: White Paper of the ACR Incidental Findings Committee II on Vascular Findings. J Am Coll Radiol 2013; 10:789-794. 3. Significant abdominal aortic atherosclerosis. 4. Diffuse coronary artery calcifications. 5. Small amount of free fluid in the pelvis of questionable significance. 6. Vertebra plana compression of L1 vertebral body with retropulsion most likely old. Correlate clinically. Electronically Signed   By: Ivar Drape M.D.   On: 11/27/2016 13:52    Procedures Procedures (including critical care time)  Medications Ordered in ED Medications  sodium chloride 0.9 % bolus 1,000 mL (0 mLs Intravenous Stopped 11/27/16 1241)  iopamidol (ISOVUE-300) 61 % injection 100 mL (100 mLs Intravenous Contrast Given 11/27/16 1305)     Initial Impression / Assessment and Plan / ED Course  I have reviewed the triage vital signs and the nursing notes.  Pertinent labs & imaging results that were available during my care of the patient were reviewed by me and considered in my medical decision making (see chart for details).  Clinical Course    Pt did not want anything for pain while here.  Pt still with LLQ abdominal pain after IVFS, so I ordered a CT abd/pelvis.  The CT was negative for anything acute.  The patient and family made aware of the aneurysm and the need for it to be followed.  Pt is stable for d/c.  Final Clinical Impressions(s) / ED Diagnoses   Final  diagnoses:  LLQ abdominal pain  Aneurysm of infrarenal abdominal aorta (HCC)    New Prescriptions New Prescriptions   No medications on file   I personally performed the services described in this documentation, which was scribed in my presence. The recorded information has been reviewed and is accurate.    Isla Pence, MD 11/27/16 [phone]

## 2016-11-27 NOTE — ED Triage Notes (Signed)
Family concerned about kidney and states she is not drinking enough

## 2016-12-17 ENCOUNTER — Emergency Department (HOSPITAL_COMMUNITY): Payer: Medicare Other

## 2016-12-17 ENCOUNTER — Encounter (HOSPITAL_COMMUNITY): Payer: Self-pay | Admitting: Cardiology

## 2016-12-17 ENCOUNTER — Emergency Department (HOSPITAL_COMMUNITY)
Admission: EM | Admit: 2016-12-17 | Discharge: 2016-12-17 | Disposition: A | Discharge status: Home / Self Care | Payer: Medicare Other | Attending: Emergency Medicine | Admitting: Emergency Medicine

## 2016-12-17 DIAGNOSIS — R410 Disorientation, unspecified: Secondary | ICD-10-CM

## 2016-12-17 DIAGNOSIS — R4182 Altered mental status, unspecified: Secondary | ICD-10-CM | POA: Diagnosis present

## 2016-12-17 DIAGNOSIS — Z85828 Personal history of other malignant neoplasm of skin: Secondary | ICD-10-CM | POA: Diagnosis not present

## 2016-12-17 DIAGNOSIS — E039 Hypothyroidism, unspecified: Secondary | ICD-10-CM | POA: Insufficient documentation

## 2016-12-17 DIAGNOSIS — I2510 Atherosclerotic heart disease of native coronary artery without angina pectoris: Secondary | ICD-10-CM | POA: Diagnosis not present

## 2016-12-17 DIAGNOSIS — M25561 Pain in right knee: Secondary | ICD-10-CM | POA: Insufficient documentation

## 2016-12-17 DIAGNOSIS — Z7982 Long term (current) use of aspirin: Secondary | ICD-10-CM | POA: Diagnosis not present

## 2016-12-17 DIAGNOSIS — Z79899 Other long term (current) drug therapy: Secondary | ICD-10-CM | POA: Diagnosis not present

## 2016-12-17 DIAGNOSIS — I10 Essential (primary) hypertension: Secondary | ICD-10-CM | POA: Diagnosis not present

## 2016-12-17 LAB — BASIC METABOLIC PANEL
Anion gap: 7 (ref 5–15)
BUN: 16 mg/dL (ref 6–20)
CO2: 23 mmol/L (ref 22–32)
CREATININE: 0.89 mg/dL (ref 0.44–1.00)
Calcium: 9.5 mg/dL (ref 8.9–10.3)
Chloride: 108 mmol/L (ref 101–111)
GFR calc Af Amer: >60 mL/min (ref >60)
GFR calc non Af Amer: 53 mL/min — ABNORMAL LOW (ref >60)
Glucose, Bld: 107 mg/dL — ABNORMAL HIGH (ref 65–99)
Potassium: 4.1 mmol/L (ref 3.5–5.1)
SODIUM: 138 mmol/L (ref 135–145)

## 2016-12-17 LAB — URINALYSIS, ROUTINE W REFLEX MICROSCOPIC
BILIRUBIN URINE: NEGATIVE
GLUCOSE, UA: NEGATIVE mg/dL
HGB URINE DIPSTICK: NEGATIVE
KETONES UR: NEGATIVE mg/dL
Leukocytes, UA: NEGATIVE
Nitrite: NEGATIVE
Protein, ur: NEGATIVE mg/dL
Specific Gravity, Urine: 1.009 (ref 1.005–1.030)
pH: 7 (ref 5.0–8.0)

## 2016-12-17 LAB — CBC
HCT: 38.9 % (ref 36.0–46.0)
Hemoglobin: 13.3 g/dL (ref 12.0–15.0)
MCH: 33.6 pg (ref 26.0–34.0)
MCHC: 34.2 g/dL (ref 30.0–36.0)
MCV: 98.2 fL (ref 78.0–100.0)
PLATELETS: 191 10*3/uL (ref 150–400)
RBC: 3.96 MIL/uL (ref 3.87–5.11)
RDW: 14 % (ref 11.5–15.5)
WBC: 7.7 10*3/uL (ref 4.0–10.5)

## 2016-12-17 NOTE — ED Triage Notes (Signed)
Woke son up at 330am and was confused.  Pt has been hallucinating and confused since 330am.  Was normal when she went to bed.  Son states pt is normally oriented.

## 2016-12-17 NOTE — ED Provider Notes (Signed)
[name] [name] Provider Note   CSN: [id number] Arrival date & time: 12/17/16  1034   By signing my name below, I, [name] [name], attest that this documentation has been prepared under the direction and in the presence of [name] Schmidt, MD. Electronically Signed: Collene [name], Scribe. 12/17/16. 11:33 AM.  History   Chief Complaint Chief Complaint  Patient presents with  . Altered Mental Status    HPI Comments: [name] [name] is a 83 y.o. female who presents to the Emergency Department complaining of altered mental status that began this morning. Per son at 3:00 this morning she was confused, though someone was in the house. Patient also noted to be talking to herself. Per son she is looked like she wasn't there. Pateint states she felt like she had a stroke. Patient has associated hallucinations. The last time the patient was seen normal was before bed. No modifying factors indicated. No new medications. She denies any facial assemetry, facial droop, problems ambulating, weakness, pain, cough, congestion, vomiting, or diarrhea.    The history is provided by the patient. No language interpreter was used.    Past Medical History:  Diagnosis Date  . Cancer ([place])    skin  . Chronic back pain   . Chronic hip pain   . Chronic ulcer of left leg ([place])   . Coronary artery disease   . Dementia   . Hypertension   . Hypothyroidism   . Seizures ([place])   . Urinary incontinence due to cognitive impairment     Patient Active Problem List   Diagnosis Date Noted  . UTI (urinary tract infection) 01/30/2014  . Dementia 01/29/2014  . Syncope 07/01/2013  . Chronic ulcer of left leg ([place]) 07/01/2013  . Seizures ([place]) 07/01/2013  . Unspecified hypothyroidism 07/01/2013  . Eosinophilia 07/01/2013    Past Surgical History:  Procedure Laterality Date  . ABDOMINAL HYSTERECTOMY    . lumbar back surgery      OB History    No data available       Home Medications    Prior to  Admission medications   Medication Sig Start Date End Date Taking? Authorizing Provider  aspirin EC 81 MG tablet Take 81 mg by mouth daily.    Historical Provider, MD  Calcium Carbonate-Vitamin D (CALCIUM 600 + D PO) Take 1 tablet by mouth daily.     Historical Provider, MD  Cyanocobalamin (VITAMIN B-12) 2500 MCG SUBL Place 1 tablet under the tongue daily.    Historical Provider, MD  donepezil (ARICEPT) 5 MG tablet Take 5 mg by mouth at bedtime.    Historical Provider, MD  fludrocortisone (FLORINEF) 0.1 MG tablet Take 0.1 mg by mouth daily.     Historical Provider, MD  HYDROcodone-acetaminophen (NORCO) 10-325 MG per tablet Take 1 tablet by mouth every 6 (six) hours as needed for moderate pain. For pain 05/04/15   Historical Provider, MD  levocetirizine (XYZAL) 2.5 MG/5ML solution Take 5 mLs by mouth every evening.  04/24/15   Historical Provider, MD  levothyroxine (SYNTHROID, LEVOTHROID) 75 MCG tablet Take 75 mcg by mouth daily before breakfast.    Historical Provider, MD  omeprazole (PRILOSEC) 20 MG capsule Take 20 mg by mouth daily before breakfast.    Historical Provider, MD  simvastatin (ZOCOR) 20 MG tablet Take 20 mg by mouth at bedtime.    Historical Provider, MD  topiramate (TOPAMAX) 100 MG tablet Take 1 tablet (100 mg total) by mouth 2 (two) times daily. 06/03/15   Nat Christen,  MD  vitamin C (ASCORBIC ACID) 500 MG tablet Take 500 mg by mouth daily.    Historical Provider, MD    Family History Family History  Problem Relation Age of Onset  . Cancer Sister     Social History Social History  Substance Use Topics  . Smoking status: Never Smoker  . Smokeless tobacco: Never Used  . Alcohol use No     Allergies   Feldene [piroxicam]   Review of Systems Review of Systems  A complete 10 system review of systems was obtained and all systems are negative except as noted in the HPI and PMH.   Physical Exam Updated Vital Signs BP 182/77   Pulse 77   Temp 97.7 F (36.5 C) (Oral)    Resp 24   Ht 5\' 1"  (1.549 m)   Wt 150 lb (68 kg)   SpO2 94%   BMI 28.34 kg/m   Physical Exam  Constitutional: She appears well-developed and well-nourished. No distress.  HENT:  Head: Normocephalic and atraumatic.  Eyes: EOM are normal. Pupils are equal, round, and reactive to light.  Neck: Normal range of motion.  Cardiovascular: Normal rate, regular rhythm and normal heart sounds.   Pulmonary/Chest: Effort normal and breath sounds normal.  Abdominal: Soft. She exhibits no distension. There is no tenderness.  Musculoskeletal: Normal range of motion.  Neurological: She is alert.  5/5 strength in major muscle groups of  bilateral upper and lower extremities. Speech normal. No facial asymetry.   Skin: Skin is warm and dry.  Psychiatric: She has a normal mood and affect. Judgment normal.  Nursing note and vitals reviewed.    ED Treatments / Results  DIAGNOSTIC STUDIES: Oxygen Saturation is 94% on RA, normal by my interpretation.    COORDINATION OF CARE: 11:30 AM Discussed treatment plan with pt at bedside and pt agreed to plan.  Labs (all labs ordered are listed, but only abnormal results are displayed) Labs Reviewed  BASIC METABOLIC PANEL - Abnormal; Notable for the following:       Result Value   Glucose, Bld 107 (*)    GFR calc non Af Amer 53 (*)    All other components within normal limits  URINALYSIS, ROUTINE W REFLEX MICROSCOPIC  CBC    EKG  EKG Interpretation  Date/Time:  Wednesday December 17 2016 10:54:40 EST Ventricular Rate:  79 PR Interval:    QRS Duration: 82 QT Interval:  412 QTC Calculation: 473 R Axis:   16 Text Interpretation:  Sinus rhythm Abnormal R-wave progression, early transition Borderline T wave abnormalities No significant change was found Confirmed by [name] Zagal  MD, [name] [name] ([id number]) on 12/17/2016 2:03:42 PM       Radiology Dg Chest 2 View  Result Date: 12/17/2016 CLINICAL DATA:  Confusion, altered mental status. EXAM: CHEST  2 VIEW  COMPARISON:  08/14/2014. FINDINGS: Trachea is midline. Heart size stable. Lungs are low in volume with minimal bibasilar subsegmental atelectasis. No airspace consolidation or pleural fluid. Upper lumbar compression fracture is likely unchanged. IMPRESSION: Minimal bibasilar atelectasis. Electronically Signed   By: Lorin Picket M.D.   On: 12/17/2016 12:38   Ct Head Wo Contrast  Result Date: 12/17/2016 CLINICAL DATA:  Confusion.  Altered mental status. EXAM: CT HEAD WITHOUT CONTRAST TECHNIQUE: Contiguous axial images were obtained from the base of the skull through the vertex without intravenous contrast. COMPARISON:  06/03/2015 FINDINGS: Brain: Mild age related volume loss. No acute intracranial abnormality. Specifically, no hemorrhage, hydrocephalus, mass lesion, acute infarction, or significant  intracranial injury. Vascular: No hyperdense vessel or unexpected calcification. Skull: No acute calvarial abnormality. Sinuses/Orbits: Visualized paranasal sinuses and mastoids clear. Orbital soft tissues unremarkable. Other: None IMPRESSION: No acute intracranial abnormality. Electronically Signed   By: Rolm Baptise M.D.   On: 12/17/2016 12:21   Dg Knee Complete 4 Views Right  Result Date: 12/17/2016 CLINICAL DATA:  Medial right knee pain, difficulty ambulating, no injury. EXAM: RIGHT KNEE - COMPLETE 4+ VIEW COMPARISON:  None. FINDINGS: No acute osseous or joint abnormality. Calcification is seen deep to the suprapatellar tendon. Mild tricompartment osteophytosis. Chondrocalcinosis in the medial and lateral compartments. Subchondral sclerosis and narrowing in the lateral compartment. IMPRESSION: Tricompartment osteoarthritis. Electronically Signed   By: Lorin Picket M.D.   On: 12/17/2016 12:36    Procedures Procedures (including critical care time)  Medications Ordered in ED Medications - No data to display   Initial Impression / Assessment and Plan / ED Course  I have reviewed the triage vital  signs and the nursing notes.  Pertinent labs & imaging results that were available during my care of the patient were reviewed by me and considered in my medical decision making (see chart for details).     3:07 PM Family reports patient has returned to baseline mental status.  Workup here in the emergency department is without significant abnormalities.  No focal weakness.  I do not think she needs additional workup or admission the hospital this time.  Close primary care follow-up.  Patient and family understand to return to the ER for new or worsening symptoms  Unclear etiology of transient confusion.  Low suspicion for TIA.  Final Clinical Impressions(s) / ED Diagnoses   Final diagnoses:  Confusion    New Prescriptions New Prescriptions   No medications on file   I personally performed the services described in this documentation, which was scribed in my presence. The recorded information has been reviewed and is accurate.        [name] Schmidt, MD 12/17/16 [phone]

## 2016-12-17 NOTE — ED Notes (Signed)
Lab at bedside obtaining blood sample.  

## 2016-12-17 NOTE — ED Notes (Signed)
Pt taken to xray at this time.

## 2017-02-14 ENCOUNTER — Emergency Department (HOSPITAL_COMMUNITY)
Admission: EM | Admit: 2017-02-14 | Discharge: 2017-02-14 | Disposition: A | Discharge status: Home / Self Care | Payer: Medicare Other | Attending: Emergency Medicine | Admitting: Emergency Medicine

## 2017-02-14 ENCOUNTER — Encounter (HOSPITAL_COMMUNITY): Payer: Self-pay

## 2017-02-14 ENCOUNTER — Emergency Department (HOSPITAL_COMMUNITY): Payer: Medicare Other

## 2017-02-14 DIAGNOSIS — E039 Hypothyroidism, unspecified: Secondary | ICD-10-CM | POA: Insufficient documentation

## 2017-02-14 DIAGNOSIS — Y999 Unspecified external cause status: Secondary | ICD-10-CM | POA: Insufficient documentation

## 2017-02-14 DIAGNOSIS — S9002XA Contusion of left ankle, initial encounter: Secondary | ICD-10-CM | POA: Insufficient documentation

## 2017-02-14 DIAGNOSIS — R42 Dizziness and giddiness: Secondary | ICD-10-CM | POA: Insufficient documentation

## 2017-02-14 DIAGNOSIS — W19XXXA Unspecified fall, initial encounter: Secondary | ICD-10-CM | POA: Insufficient documentation

## 2017-02-14 DIAGNOSIS — I2510 Atherosclerotic heart disease of native coronary artery without angina pectoris: Secondary | ICD-10-CM | POA: Diagnosis not present

## 2017-02-14 DIAGNOSIS — I10 Essential (primary) hypertension: Secondary | ICD-10-CM | POA: Diagnosis not present

## 2017-02-14 DIAGNOSIS — Z85828 Personal history of other malignant neoplasm of skin: Secondary | ICD-10-CM | POA: Diagnosis not present

## 2017-02-14 DIAGNOSIS — Z7982 Long term (current) use of aspirin: Secondary | ICD-10-CM | POA: Diagnosis not present

## 2017-02-14 DIAGNOSIS — S79911A Unspecified injury of right hip, initial encounter: Secondary | ICD-10-CM | POA: Diagnosis present

## 2017-02-14 DIAGNOSIS — S7001XA Contusion of right hip, initial encounter: Secondary | ICD-10-CM

## 2017-02-14 DIAGNOSIS — Y939 Activity, unspecified: Secondary | ICD-10-CM | POA: Diagnosis not present

## 2017-02-14 DIAGNOSIS — Y92009 Unspecified place in unspecified non-institutional (private) residence as the place of occurrence of the external cause: Secondary | ICD-10-CM | POA: Diagnosis not present

## 2017-02-14 DIAGNOSIS — Z79899 Other long term (current) drug therapy: Secondary | ICD-10-CM | POA: Insufficient documentation

## 2017-02-14 LAB — CBC WITH DIFFERENTIAL/PLATELET
Basophils Absolute: 0 10*3/uL (ref 0.0–0.1)
Basophils Relative: 1 %
Eosinophils Absolute: 0.2 10*3/uL (ref 0.0–0.7)
Eosinophils Relative: 2 %
HCT: 38 % (ref 36.0–46.0)
HEMOGLOBIN: 13 g/dL (ref 12.0–15.0)
Lymphocytes Relative: 27 %
Lymphs Abs: 2.2 10*3/uL (ref 0.7–4.0)
MCH: 33.7 pg (ref 26.0–34.0)
MCHC: 34.2 g/dL (ref 30.0–36.0)
MCV: 98.4 fL (ref 78.0–100.0)
Monocytes Absolute: 0.6 10*3/uL (ref 0.1–1.0)
Monocytes Relative: 7 %
NEUTROS PCT: 63 %
Neutro Abs: 5.2 10*3/uL (ref 1.7–7.7)
Platelets: 197 10*3/uL (ref 150–400)
RBC: 3.86 MIL/uL — ABNORMAL LOW (ref 3.87–5.11)
RDW: 13.3 % (ref 11.5–15.5)
WBC: 8.1 10*3/uL (ref 4.0–10.5)

## 2017-02-14 LAB — URINALYSIS, ROUTINE W REFLEX MICROSCOPIC
Bilirubin Urine: NEGATIVE
Glucose, UA: NEGATIVE mg/dL
HGB URINE DIPSTICK: NEGATIVE
KETONES UR: NEGATIVE mg/dL
Leukocytes, UA: NEGATIVE
Nitrite: NEGATIVE
Protein, ur: NEGATIVE mg/dL
Specific Gravity, Urine: 1.006 (ref 1.005–1.030)
pH: 7 (ref 5.0–8.0)

## 2017-02-14 LAB — TROPONIN I: Troponin I: <0.03 ng/mL (ref <0.03)

## 2017-02-14 LAB — COMPREHENSIVE METABOLIC PANEL
ALT: 12 U/L — AB (ref 14–54)
ANION GAP: 7 (ref 5–15)
AST: 24 U/L (ref 15–41)
Albumin: 3.7 g/dL (ref 3.5–5.0)
Alkaline Phosphatase: 62 U/L (ref 38–126)
BUN: 17 mg/dL (ref 6–20)
CHLORIDE: 109 mmol/L (ref 101–111)
CO2: 23 mmol/L (ref 22–32)
CREATININE: 0.91 mg/dL (ref 0.44–1.00)
Calcium: 9.7 mg/dL (ref 8.9–10.3)
GFR calc non Af Amer: 52 mL/min — ABNORMAL LOW (ref >60)
GFR, EST AFRICAN AMERICAN: 60 mL/min — AB (ref >60)
Glucose, Bld: 92 mg/dL (ref 65–99)
Potassium: 4.2 mmol/L (ref 3.5–5.1)
Sodium: 139 mmol/L (ref 135–145)
Total Bilirubin: 0.7 mg/dL (ref 0.3–1.2)
Total Protein: 6 g/dL — ABNORMAL LOW (ref 6.5–8.1)

## 2017-02-14 MED ORDER — ACETAMINOPHEN 500 MG PO TABS
1000.0000 mg | ORAL_TABLET | Freq: Once | ORAL | Status: AC
  Administered 2017-02-14: 1000 mg via ORAL
  Filled 2017-02-14: qty 2

## 2017-02-14 NOTE — ED Notes (Signed)
Pt able to ambulate with use of standard walker

## 2017-02-14 NOTE — ED Provider Notes (Signed)
[facility] DEPT Provider Note   CSN: [id number] Arrival date & time: 02/14/17  1355  By signing my name below, I, Higinio Plan, attest that this documentation has been prepared under the direction and in the presence of Isla Pence, MD . Electronically Signed: Higinio Plan, Scribe. 02/14/2017. 2:03 PM.  History   Chief Complaint Chief Complaint  Patient presents with  . Fall  . Ankle Pain  . Hip Pain   The history is provided by the EMS personnel and the patient. No language interpreter was used.   HPI Comments: Amonda Brillhart is a 83 y.o. female with PMHx of chronic hip and back pain. HTN, dementia, and seizures, brought in by EMS to the Emergency Department from home with a complaint of gradually worsening, right hip and left ankle pain s/p a fall that occurred just PTA. Pt reports she is unsure of her mechanism of fall. She states associated dizziness that she experiences chronically when sitting up for long periods of time. She denies any head injury or loss of consciousness, chest pain, or shortness of breath.   Past Medical History:  Diagnosis Date  . Cancer ([place])    skin  . Chronic back pain   . Chronic hip pain   . Chronic ulcer of left leg ([place])   . Coronary artery disease   . Dementia   . Hypertension   . Hypothyroidism   . Seizures ([place])   . Urinary incontinence due to cognitive impairment     Patient Active Problem List   Diagnosis Date Noted  . UTI (urinary tract infection) 01/30/2014  . Dementia 01/29/2014  . Syncope 07/01/2013  . Chronic ulcer of left leg ([place]) 07/01/2013  . Seizures ([place]) 07/01/2013  . Unspecified hypothyroidism 07/01/2013  . Eosinophilia 07/01/2013    Past Surgical History:  Procedure Laterality Date  . ABDOMINAL HYSTERECTOMY    . lumbar back surgery      OB History    No data available     Home Medications    Prior to Admission medications   Medication Sig Start Date End Date Taking? Authorizing Provider  aspirin EC 81 MG  tablet Take 81 mg by mouth daily.   Yes Historical Provider, MD  Calcium Carbonate-Vitamin D (CALCIUM 600 + D PO) Take 1 tablet by mouth daily.    Yes Historical Provider, MD  Cyanocobalamin (VITAMIN B-12) 2500 MCG SUBL Place 1 tablet under the tongue daily.   Yes Historical Provider, MD  donepezil (ARICEPT) 5 MG tablet Take 5 mg by mouth at bedtime.   Yes Historical Provider, MD  fludrocortisone (FLORINEF) 0.1 MG tablet Take 0.1 mg by mouth daily.    Yes Historical Provider, MD  HYDROcodone-acetaminophen (NORCO) 10-325 MG per tablet Take 1 tablet by mouth every 6 (six) hours as needed for moderate pain. For pain 05/04/15  Yes Historical Provider, MD  levocetirizine (XYZAL) 2.5 MG/5ML solution Take 5 mLs by mouth every evening.  04/24/15  Yes Historical Provider, MD  levothyroxine (SYNTHROID, LEVOTHROID) 75 MCG tablet Take 75 mcg by mouth daily before breakfast.   Yes Historical Provider, MD  simvastatin (ZOCOR) 20 MG tablet Take 20 mg by mouth at bedtime.   Yes Historical Provider, MD  topiramate (TOPAMAX) 100 MG tablet Take 1 tablet (100 mg total) by mouth 2 (two) times daily. 06/03/15  Yes Nat Christen, MD  vitamin C (ASCORBIC ACID) 500 MG tablet Take 500 mg by mouth daily.   Yes Historical Provider, MD    Family History Family  History  Problem Relation Age of Onset  . Cancer Sister     Social History Social History  Substance Use Topics  . Smoking status: Never Smoker  . Smokeless tobacco: Never Used  . Alcohol use No   Allergies   Feldene [piroxicam]  Review of Systems Review of Systems  Respiratory: Negative for shortness of breath.   Cardiovascular: Negative for chest pain.  Musculoskeletal: Positive for arthralgias.  Neurological: Positive for dizziness. Negative for syncope.  All other systems reviewed and are negative.  Physical Exam Updated Vital Signs BP (!) 187/64 (BP Location: Left Arm)   Pulse 65   Temp 97.5 F (36.4 C) (Oral)   Resp 18   Wt 150 lb (68 kg)    SpO2 93%   BMI 28.34 kg/m   Physical Exam  Constitutional: She is oriented to person, place, and time. She appears well-developed and well-nourished. No distress.  HENT:  Head: Normocephalic and atraumatic.  Eyes: EOM are normal.  Neck: Normal range of motion.  Cardiovascular: Normal rate, regular rhythm and normal heart sounds.   Pulmonary/Chest: Effort normal and breath sounds normal.  Abdominal: Soft. She exhibits no distension. There is no tenderness.  Musculoskeletal: Normal range of motion. She exhibits tenderness.  Right hip tenderness. Left ankle tenderness.   Neurological: She is alert and oriented to person, place, and time.  Skin: Skin is warm and dry.  Psychiatric: She has a normal mood and affect. Judgment normal.  Nursing note and vitals reviewed.  ED Treatments / Results  DIAGNOSTIC STUDIES:    COORDINATION OF CARE:  2:01 PM Discussed treatment plan with pt at bedside and pt agreed to plan.  Labs (all labs ordered are listed, but only abnormal results are displayed) Labs Reviewed  COMPREHENSIVE METABOLIC PANEL - Abnormal; Notable for the following:       Result Value   Total Protein 6.0 (*)    ALT 12 (*)    GFR calc non Af Amer 52 (*)    GFR calc Af Amer 60 (*)    All other components within normal limits  CBC WITH DIFFERENTIAL/PLATELET - Abnormal; Notable for the following:    RBC 3.86 (*)    All other components within normal limits  URINALYSIS, ROUTINE W REFLEX MICROSCOPIC - Abnormal; Notable for the following:    Color, Urine STRAW (*)    All other components within normal limits  TROPONIN I    EKG  EKG Interpretation  Date/Time:  Saturday February 14 2017 14:12:51 EDT Ventricular Rate:  66 PR Interval:    QRS Duration: 70 QT Interval:  565 QTC Calculation: 593 R Axis:   28 Text Interpretation:  Sinus rhythm Prolonged PR interval Borderline abnrm T, anterolateral leads Prolonged QT interval No significant change since last tracing Confirmed by  [facility] MD, Jeovany Huitron ([id number]) on 02/14/2017 2:38:37 PM       Radiology Dg Chest 2 View  Result Date: 02/14/2017 CLINICAL DATA:  Fall. EXAM: CHEST  2 VIEW COMPARISON:  Radiographs of December 17, 2016. FINDINGS: Stable cardiomediastinal silhouette. No pneumothorax is noted. Mild bibasilar subsegmental atelectasis is noted. No significant pleural effusion is noted. Bony thorax is unremarkable. IMPRESSION: Mild bibasilar subsegmental atelectasis. Electronically Signed   By: Marijo Conception, M.D.   On: 02/14/2017 15:50   Dg Ankle Complete Left  Result Date: 02/14/2017 CLINICAL DATA:  Left ankle pain after fall at home. EXAM: LEFT ANKLE COMPLETE - 3+ VIEW COMPARISON:  None. FINDINGS: There is no evidence of fracture,  dislocation, or joint effusion. There is no evidence of arthropathy or other focal bone abnormality. Soft tissues are unremarkable. IMPRESSION: Normal left ankle. Electronically Signed   By: Marijo Conception, M.D.   On: 02/14/2017 15:53   Dg Hip Unilat W Or Wo Pelvis 2-3 Views Right  Result Date: 02/14/2017 CLINICAL DATA:  Right hip pain after fall at home. EXAM: DG HIP (WITH OR WITHOUT PELVIS) 2-3V RIGHT COMPARISON:  None. FINDINGS: There is no evidence of hip fracture or dislocation. There is no evidence of arthropathy or other focal bone abnormality. IMPRESSION: Normal right hip. Electronically Signed   By: Marijo Conception, M.D.   On: 02/14/2017 15:52    Procedures Procedures (including critical care time)  Medications Ordered in ED Medications  acetaminophen (TYLENOL) tablet 1,000 mg (1,000 mg Oral Given 02/14/17 1710)    Initial Impression / Assessment and Plan / ED Course  I have reviewed the triage vital signs and the nursing notes.  Pertinent labs & imaging results that were available during my care of the patient were reviewed by me and considered in my medical decision making (see chart for details).   Pt is feeling better.  She is able to ambulate with a walker (she has at  home, but does not like to use).  She is encouraged to use her walker until she feels better.  She knows to return if worse.  I personally performed the services described in this documentation, which was scribed in my presence. The recorded information has been reviewed and is accurate.   Final Clinical Impressions(s) / ED Diagnoses   Final diagnoses:  Fall, initial encounter  Contusion of right hip, initial encounter  Contusion of left ankle, initial encounter    New Prescriptions New Prescriptions   No medications on file     Isla Pence, MD 02/14/17 1729

## 2017-02-14 NOTE — ED Triage Notes (Signed)
Pt brought in by EMS due to fall at home. Pt unaware of how she fell. Complaining of pain left ankle and right hip. No brusing noted . Complaining of dizziness when sitting up

## 2017-05-31 ENCOUNTER — Observation Stay (HOSPITAL_COMMUNITY)
Admission: EM | Admit: 2017-05-31 | Discharge: 2017-06-02 | Disposition: A | Discharge status: Home / Self Care | Payer: Medicare Other | Attending: Internal Medicine | Admitting: Internal Medicine

## 2017-05-31 ENCOUNTER — Emergency Department (HOSPITAL_COMMUNITY): Payer: Medicare Other

## 2017-05-31 ENCOUNTER — Encounter (HOSPITAL_COMMUNITY): Payer: Self-pay | Admitting: Emergency Medicine

## 2017-05-31 DIAGNOSIS — Z7982 Long term (current) use of aspirin: Secondary | ICD-10-CM | POA: Diagnosis not present

## 2017-05-31 DIAGNOSIS — R0600 Dyspnea, unspecified: Principal | ICD-10-CM

## 2017-05-31 DIAGNOSIS — E876 Hypokalemia: Secondary | ICD-10-CM

## 2017-05-31 DIAGNOSIS — R778 Other specified abnormalities of plasma proteins: Secondary | ICD-10-CM

## 2017-05-31 DIAGNOSIS — R0602 Shortness of breath: Secondary | ICD-10-CM | POA: Diagnosis present

## 2017-05-31 DIAGNOSIS — R7989 Other specified abnormal findings of blood chemistry: Secondary | ICD-10-CM

## 2017-05-31 DIAGNOSIS — R748 Abnormal levels of other serum enzymes: Secondary | ICD-10-CM | POA: Diagnosis not present

## 2017-05-31 DIAGNOSIS — F0390 Unspecified dementia without behavioral disturbance: Secondary | ICD-10-CM

## 2017-05-31 DIAGNOSIS — E785 Hyperlipidemia, unspecified: Secondary | ICD-10-CM

## 2017-05-31 DIAGNOSIS — Z85828 Personal history of other malignant neoplasm of skin: Secondary | ICD-10-CM | POA: Diagnosis not present

## 2017-05-31 DIAGNOSIS — I10 Essential (primary) hypertension: Secondary | ICD-10-CM | POA: Diagnosis not present

## 2017-05-31 DIAGNOSIS — E039 Hypothyroidism, unspecified: Secondary | ICD-10-CM | POA: Diagnosis not present

## 2017-05-31 DIAGNOSIS — I2510 Atherosclerotic heart disease of native coronary artery without angina pectoris: Secondary | ICD-10-CM | POA: Diagnosis not present

## 2017-05-31 DIAGNOSIS — Z79899 Other long term (current) drug therapy: Secondary | ICD-10-CM | POA: Diagnosis not present

## 2017-05-31 DIAGNOSIS — R079 Chest pain, unspecified: Secondary | ICD-10-CM | POA: Diagnosis present

## 2017-05-31 NOTE — ED Triage Notes (Signed)
Patient brought in by EMS, states patient was complaining of shortness of breath. Per EMS, patient was tachycardic with rate of 150 upon arrival to home. Has resolved since transport to ER. Patient denies pain at this time.

## 2017-06-01 ENCOUNTER — Emergency Department (HOSPITAL_COMMUNITY): Payer: Medicare Other

## 2017-06-01 ENCOUNTER — Encounter (HOSPITAL_COMMUNITY): Payer: Self-pay | Admitting: *Deleted

## 2017-06-01 ENCOUNTER — Observation Stay (HOSPITAL_BASED_OUTPATIENT_CLINIC_OR_DEPARTMENT_OTHER): Payer: Medicare Other

## 2017-06-01 DIAGNOSIS — E784 Other hyperlipidemia: Secondary | ICD-10-CM | POA: Diagnosis not present

## 2017-06-01 DIAGNOSIS — E876 Hypokalemia: Secondary | ICD-10-CM

## 2017-06-01 DIAGNOSIS — R0989 Other specified symptoms and signs involving the circulatory and respiratory systems: Secondary | ICD-10-CM | POA: Diagnosis not present

## 2017-06-01 DIAGNOSIS — R748 Abnormal levels of other serum enzymes: Secondary | ICD-10-CM | POA: Diagnosis not present

## 2017-06-01 DIAGNOSIS — R0600 Dyspnea, unspecified: Secondary | ICD-10-CM | POA: Diagnosis not present

## 2017-06-01 DIAGNOSIS — I350 Nonrheumatic aortic (valve) stenosis: Secondary | ICD-10-CM

## 2017-06-01 DIAGNOSIS — R0609 Other forms of dyspnea: Secondary | ICD-10-CM

## 2017-06-01 DIAGNOSIS — F0390 Unspecified dementia without behavioral disturbance: Secondary | ICD-10-CM

## 2017-06-01 DIAGNOSIS — R778 Other specified abnormalities of plasma proteins: Secondary | ICD-10-CM | POA: Insufficient documentation

## 2017-06-01 DIAGNOSIS — R7989 Other specified abnormal findings of blood chemistry: Secondary | ICD-10-CM | POA: Diagnosis not present

## 2017-06-01 DIAGNOSIS — E785 Hyperlipidemia, unspecified: Secondary | ICD-10-CM

## 2017-06-01 DIAGNOSIS — R072 Precordial pain: Secondary | ICD-10-CM

## 2017-06-01 DIAGNOSIS — R079 Chest pain, unspecified: Secondary | ICD-10-CM | POA: Diagnosis present

## 2017-06-01 LAB — URINALYSIS, ROUTINE W REFLEX MICROSCOPIC
BILIRUBIN URINE: NEGATIVE
Glucose, UA: NEGATIVE mg/dL
HGB URINE DIPSTICK: NEGATIVE
KETONES UR: NEGATIVE mg/dL
LEUKOCYTES UA: NEGATIVE
NITRITE: POSITIVE — AB
Protein, ur: NEGATIVE mg/dL
SPECIFIC GRAVITY, URINE: 1.023 (ref 1.005–1.030)
Squamous Epithelial / HPF: NONE SEEN
pH: 8 (ref 5.0–8.0)

## 2017-06-01 LAB — CBC WITH DIFFERENTIAL/PLATELET
Basophils Absolute: 0 10*3/uL (ref 0.0–0.1)
Basophils Relative: 1 %
Eosinophils Absolute: 0.2 10*3/uL (ref 0.0–0.7)
Eosinophils Relative: 4 %
HEMATOCRIT: 38.7 % (ref 36.0–46.0)
HEMOGLOBIN: 13 g/dL (ref 12.0–15.0)
LYMPHS ABS: 2.2 10*3/uL (ref 0.7–4.0)
LYMPHS PCT: 36 %
MCH: 32.7 pg (ref 26.0–34.0)
MCHC: 33.6 g/dL (ref 30.0–36.0)
MCV: 97.2 fL (ref 78.0–100.0)
Monocytes Absolute: 0.4 10*3/uL (ref 0.1–1.0)
Monocytes Relative: 7 %
NEUTROS PCT: 52 %
Neutro Abs: 3.2 10*3/uL (ref 1.7–7.7)
Platelets: 157 10*3/uL (ref 150–400)
RBC: 3.98 MIL/uL (ref 3.87–5.11)
RDW: 13.5 % (ref 11.5–15.5)
WBC: 6.2 10*3/uL (ref 4.0–10.5)

## 2017-06-01 LAB — LIPID PANEL
CHOL/HDL RATIO: 2.3 ratio
Cholesterol: 140 mg/dL (ref 0–200)
HDL: 60 mg/dL (ref >40)
LDL CALC: 64 mg/dL (ref 0–99)
Triglycerides: 78 mg/dL (ref <150)
VLDL: 16 mg/dL (ref 0–40)

## 2017-06-01 LAB — D-DIMER, QUANTITATIVE (NOT AT ARMC): D DIMER QUANT: 1.07 ug{FEU}/mL — AB (ref 0.00–0.50)

## 2017-06-01 LAB — ECHOCARDIOGRAM COMPLETE
Height: 60 in
WEIGHTICAEL: 2127 [oz_av]

## 2017-06-01 LAB — BASIC METABOLIC PANEL
Anion gap: 8 (ref 5–15)
BUN: 18 mg/dL (ref 6–20)
CHLORIDE: 110 mmol/L (ref 101–111)
CO2: 23 mmol/L (ref 22–32)
CREATININE: 0.88 mg/dL (ref 0.44–1.00)
Calcium: 9.6 mg/dL (ref 8.9–10.3)
GFR calc Af Amer: >60 mL/min (ref >60)
GFR calc non Af Amer: 53 mL/min — ABNORMAL LOW (ref >60)
GLUCOSE: 104 mg/dL — AB (ref 65–99)
Potassium: 3.4 mmol/L — ABNORMAL LOW (ref 3.5–5.1)
SODIUM: 141 mmol/L (ref 135–145)

## 2017-06-01 LAB — TROPONIN I
TROPONIN I: 0.04 ng/mL — AB (ref <0.03)
Troponin I: 0.03 ng/mL (ref <0.03)
Troponin I: 0.04 ng/mL (ref <0.03)
Troponin I: <0.03 ng/mL (ref <0.03)

## 2017-06-01 LAB — TSH: TSH: 0.386 u[IU]/mL (ref 0.350–4.500)

## 2017-06-01 LAB — BRAIN NATRIURETIC PEPTIDE: B NATRIURETIC PEPTIDE 5: 281 pg/mL — AB (ref 0.0–100.0)

## 2017-06-01 LAB — MAGNESIUM: Magnesium: 2 mg/dL (ref 1.7–2.4)

## 2017-06-01 MED ORDER — HYDRALAZINE HCL 20 MG/ML IJ SOLN: 10.0000 mg | Freq: Four times a day (QID) | INTRAMUSCULAR | Status: DC | PRN

## 2017-06-01 MED ORDER — TOPIRAMATE 100 MG PO TABS
100.0000 mg | ORAL_TABLET | Freq: Two times a day (BID) | ORAL | Status: DC
  Administered 2017-06-01 – 2017-06-02 (×3): 100 mg via ORAL
  Filled 2017-06-01 (×3): qty 1

## 2017-06-01 MED ORDER — DONEPEZIL HCL 5 MG PO TABS
10.0000 mg | ORAL_TABLET | Freq: Every day | ORAL | Status: DC
  Administered 2017-06-01: 10 mg via ORAL
  Filled 2017-06-01: qty 2

## 2017-06-01 MED ORDER — ACETAMINOPHEN 325 MG PO TABS: 650.0000 mg | ORAL_TABLET | Freq: Four times a day (QID) | ORAL | Status: DC | PRN

## 2017-06-01 MED ORDER — ACETAMINOPHEN 650 MG RE SUPP: 650.0000 mg | Freq: Four times a day (QID) | RECTAL | Status: DC | PRN

## 2017-06-01 MED ORDER — ENOXAPARIN SODIUM 40 MG/0.4ML ~~LOC~~ SOLN: 40.0000 mg | SUBCUTANEOUS | Status: DC

## 2017-06-01 MED ORDER — SIMVASTATIN 20 MG PO TABS
20.0000 mg | ORAL_TABLET | Freq: Every day | ORAL | Status: DC
  Administered 2017-06-01: 20 mg via ORAL
  Filled 2017-06-01: qty 1

## 2017-06-01 MED ORDER — AMLODIPINE BESYLATE 5 MG PO TABS
2.5000 mg | ORAL_TABLET | Freq: Every day | ORAL | Status: DC
  Administered 2017-06-01 – 2017-06-02 (×2): 2.5 mg via ORAL
  Filled 2017-06-01 (×2): qty 1

## 2017-06-01 MED ORDER — VITAMIN C 500 MG PO TABS
500.0000 mg | ORAL_TABLET | Freq: Every day | ORAL | Status: DC
  Administered 2017-06-01 – 2017-06-02 (×2): 500 mg via ORAL
  Filled 2017-06-01 (×2): qty 1

## 2017-06-01 MED ORDER — LEVOTHYROXINE SODIUM 75 MCG PO TABS
75.0000 ug | ORAL_TABLET | Freq: Every day | ORAL | Status: DC
  Administered 2017-06-02: 75 ug via ORAL
  Filled 2017-06-01: qty 1

## 2017-06-01 MED ORDER — ASPIRIN 81 MG PO CHEW
CHEWABLE_TABLET | ORAL | Status: AC
  Filled 2017-06-01: qty 1

## 2017-06-01 MED ORDER — ONDANSETRON HCL 4 MG PO TABS: 4.0000 mg | ORAL_TABLET | Freq: Four times a day (QID) | ORAL | Status: DC | PRN

## 2017-06-01 MED ORDER — DONEPEZIL HCL 5 MG PO TABS: 5.0000 mg | ORAL_TABLET | Freq: Every day | ORAL | Status: DC

## 2017-06-01 MED ORDER — ASPIRIN EC 81 MG PO TBEC
81.0000 mg | DELAYED_RELEASE_TABLET | Freq: Every day | ORAL | Status: DC
  Administered 2017-06-01 – 2017-06-02 (×2): 81 mg via ORAL
  Filled 2017-06-01 (×2): qty 1

## 2017-06-01 MED ORDER — ONDANSETRON HCL 4 MG/2ML IJ SOLN
4.0000 mg | Freq: Four times a day (QID) | INTRAMUSCULAR | Status: DC | PRN
  Administered 2017-06-01: 4 mg via INTRAVENOUS
  Filled 2017-06-01: qty 2

## 2017-06-01 MED ORDER — LORATADINE 10 MG PO TABS
5.0000 mg | ORAL_TABLET | Freq: Every day | ORAL | Status: DC
  Administered 2017-06-01: 5 mg via ORAL
  Filled 2017-06-01: qty 1

## 2017-06-01 MED ORDER — HYDROCODONE-ACETAMINOPHEN 10-325 MG PO TABS
1.0000 | ORAL_TABLET | Freq: Four times a day (QID) | ORAL | Status: DC | PRN
  Administered 2017-06-01: 1 via ORAL
  Filled 2017-06-01: qty 1

## 2017-06-01 MED ORDER — LEVOCETIRIZINE DIHYDROCHLORIDE 2.5 MG/5ML PO SOLN: 2.5000 mg | Freq: Every evening | ORAL | Status: DC

## 2017-06-01 MED ORDER — ENOXAPARIN SODIUM 30 MG/0.3ML ~~LOC~~ SOLN
30.0000 mg | SUBCUTANEOUS | Status: DC
Start: 2017-06-01 — End: 2017-06-02
  Administered 2017-06-01: 30 mg via SUBCUTANEOUS
  Filled 2017-06-01: qty 0.3

## 2017-06-01 MED ORDER — HYDRALAZINE HCL 20 MG/ML IJ SOLN
5.0000 mg | Freq: Four times a day (QID) | INTRAMUSCULAR | Status: DC | PRN
  Administered 2017-06-01: 5 mg via INTRAVENOUS
  Filled 2017-06-01 (×2): qty 1

## 2017-06-01 MED ORDER — POTASSIUM CHLORIDE CRYS ER 20 MEQ PO TBCR
20.0000 meq | EXTENDED_RELEASE_TABLET | Freq: Once | ORAL | Status: AC
  Administered 2017-06-01: 20 meq via ORAL
  Filled 2017-06-01: qty 1

## 2017-06-01 MED ORDER — IOPAMIDOL (ISOVUE-370) INJECTION 76%
100.0000 mL | Freq: Once | INTRAVENOUS | Status: AC | PRN
  Administered 2017-06-01: 100 mL via INTRAVENOUS

## 2017-06-01 MED ORDER — ASPIRIN 81 MG PO CHEW
324.0000 mg | CHEWABLE_TABLET | Freq: Once | ORAL | Status: AC
  Administered 2017-06-01: 324 mg via ORAL
  Filled 2017-06-01: qty 4

## 2017-06-01 MED ORDER — FLUDROCORTISONE ACETATE 0.1 MG PO TABS
0.1000 mg | ORAL_TABLET | Freq: Every day | ORAL | Status: DC
Start: 2017-06-01 — End: 2017-06-01
  Administered 2017-06-01: 0.1 mg via ORAL
  Filled 2017-06-01 (×3): qty 1

## 2017-06-01 MED ORDER — AMLODIPINE BESYLATE 5 MG PO TABS: 5.0000 mg | ORAL_TABLET | Freq: Every day | ORAL | Status: DC

## 2017-06-01 NOTE — ED Provider Notes (Signed)
[facility] DEPT Provider Note   CSN: [id number] Arrival date & time: 05/31/17  2327     History   Chief Complaint Chief Complaint  Patient presents with  . Shortness of Breath    HPI [name] [name] is a 83 y.o. female.  Patient arrives from home by EMS. Patient's son reports that patient awoke from sleep and walked to a chair in the kitchen where she normally sits. She was huffing and puffing and stated that she was short of breath. He's never seen this from her before. She does not have any heart or lung problems. She denied any chest pain. No cough or fever. EMS was activated. EMS reports that she was tachycardic on their initial arrival but there was no documented rhythm strip. She arrives with normal sinus rhythm in the 70s. She states she feels well at this time. Her shortness of breath has resolved. No chest pain. No cough or fever. No leg pain or leg swelling. She had a normal day today. Good by mouth intake and urine output. No recent medication changes. She appears to be at her baseline now according to her family.   The history is provided by the patient, the EMS personnel and a relative.  Shortness of Breath  Pertinent negatives include no fever, no headaches, no rhinorrhea, no cough, no chest pain, no vomiting, no abdominal pain and no leg swelling.    Past Medical History:  Diagnosis Date  . Cancer ([place])    skin  . Chronic back pain   . Chronic hip pain   . Chronic ulcer of left leg ([place])   . Coronary artery disease   . Dementia   . Hypertension   . Hypothyroidism   . Seizures ([place])   . Urinary incontinence due to cognitive impairment     Patient Active Problem List   Diagnosis Date Noted  . UTI (urinary tract infection) 01/30/2014  . Dementia 01/29/2014  . Syncope 07/01/2013  . Chronic ulcer of left leg ([place]) 07/01/2013  . Seizures ([place]) 07/01/2013  . Unspecified hypothyroidism 07/01/2013  . Eosinophilia 07/01/2013    Past Surgical History:    Procedure Laterality Date  . ABDOMINAL HYSTERECTOMY    . lumbar back surgery      OB History    No data available       Home Medications    Prior to Admission medications   Medication Sig Start Date End Date Taking? Authorizing Provider  aspirin EC 81 MG tablet Take 81 mg by mouth daily.    [provider]  Calcium Carbonate-Vitamin D (CALCIUM 600 + D PO) Take 1 tablet by mouth daily.     [provider]  Cyanocobalamin (VITAMIN B-12) 2500 MCG SUBL Place 1 tablet under the tongue daily.    [provider]  donepezil (ARICEPT) 5 MG tablet Take 5 mg by mouth at bedtime.    [provider]  fludrocortisone (FLORINEF) 0.1 MG tablet Take 0.1 mg by mouth daily.     [provider]  HYDROcodone-acetaminophen (NORCO) 10-325 MG per tablet Take 1 tablet by mouth every 6 (six) hours as needed for moderate pain. For pain 05/04/15   [provider]  levocetirizine (XYZAL) 2.5 MG/5ML solution Take 5 mLs by mouth every evening.  04/24/15   [provider]  levothyroxine (SYNTHROID, LEVOTHROID) 75 MCG tablet Take 75 mcg by mouth daily before breakfast.    [provider]  simvastatin (ZOCOR) 20 MG tablet Take 20 mg by mouth  at bedtime.    [provider]  topiramate (TOPAMAX) 100 MG tablet Take 1 tablet (100 mg total) by mouth 2 (two) times daily. 06/03/15   Nat Christen, MD  vitamin C (ASCORBIC ACID) 500 MG tablet Take 500 mg by mouth daily.    [provider]    Family History Family History  Problem Relation Age of Onset  . Cancer Sister     Social History Social History  Substance Use Topics  . Smoking status: Never Smoker  . Smokeless tobacco: Never Used  . Alcohol use No     Allergies   Feldene [piroxicam]   Review of Systems Review of Systems  Constitutional: Negative for activity change, appetite change and fever.  HENT: Negative for congestion and rhinorrhea.   Eyes: Negative for visual  disturbance.  Respiratory: Positive for shortness of breath. Negative for cough and chest tightness.   Cardiovascular: Negative for chest pain and leg swelling.  Gastrointestinal: Negative for abdominal pain, nausea and vomiting.  Genitourinary: Negative for dysuria, hematuria, vaginal bleeding and vaginal discharge.  Musculoskeletal: Negative for arthralgias and myalgias.  Neurological: Negative for dizziness, weakness and headaches.    all other systems are negative except as noted in the HPI and PMH.    Physical Exam Updated Vital Signs BP (!) 115/98 (BP Location: Left Arm)   Pulse 75   Temp 97.6 F (36.4 C) (Oral)   Resp (!) 25   Ht 5' (1.524 m)   Wt 68 kg (150 lb)   SpO2 94%   BMI 29.29 kg/m   Physical Exam  Constitutional: She is oriented to person, place, and time. She appears well-developed and well-nourished. No distress.  Hard of hearing  HENT:  Head: Normocephalic and atraumatic.  Mouth/Throat: Oropharynx is clear and moist. No oropharyngeal exudate.  Eyes: Conjunctivae and EOM are normal. Pupils are equal, round, and reactive to light.  Neck: Normal range of motion. Neck supple.  No meningismus.  Cardiovascular: Normal rate, regular rhythm, normal heart sounds and intact distal pulses.   No murmur heard. Pulmonary/Chest: Effort normal and breath sounds normal. No respiratory distress. She has no wheezes. She exhibits no tenderness.  Lung clear  Abdominal: Soft. There is no tenderness. There is no rebound and no guarding.  Musculoskeletal: Normal range of motion. She exhibits no edema or tenderness.  Neurological: She is alert and oriented to person, place, and time. No cranial nerve deficit. She exhibits normal muscle tone. Coordination normal.   5/5 strength throughout. CN 2-12 intact.Equal grip strength.   Skin: Skin is warm. Capillary refill takes less than 2 seconds.  Psychiatric: She has a normal mood and affect. Her behavior is normal.  Nursing note and  vitals reviewed.    ED Treatments / Results  Labs (all labs ordered are listed, but only abnormal results are displayed) Labs Reviewed  BASIC METABOLIC PANEL - Abnormal; Notable for the following:       Result Value   Potassium 3.4 (*)    Glucose, Bld 104 (*)    GFR calc non Af Amer 53 (*)    All other components within normal limits  BRAIN NATRIURETIC PEPTIDE - Abnormal; Notable for the following:    B Natriuretic Peptide 281.0 (*)    All other components within normal limits  URINALYSIS, ROUTINE W REFLEX MICROSCOPIC - Abnormal; Notable for the following:    APPearance HAZY (*)    Nitrite POSITIVE (*)    Bacteria, UA RARE (*)    All  other components within normal limits  D-DIMER, QUANTITATIVE (NOT AT [facility]) - Abnormal; Notable for the following:    D-Dimer, Quant 1.07 (*)    All other components within normal limits  TROPONIN I - Abnormal; Notable for the following:    Troponin I 0.04 (*)    All other components within normal limits  TROPONIN I - Abnormal; Notable for the following:    Troponin I 0.04 (*)    All other components within normal limits  CBC WITH DIFFERENTIAL/PLATELET  TROPONIN I    EKG  EKG Interpretation  Date/Time:  Sunday May 31 2017 23:31:09 EDT Ventricular Rate:  77 PR Interval:    QRS Duration: 98 QT Interval:  401 QTC Calculation: 454 R Axis:   30 Text Interpretation:  Sinus rhythm Borderline prolonged PR interval No significant change was found Confirmed by Ezequiel Essex [phone]) on 06/01/2017 12:03:16 AM       Radiology Dg Chest 2 View  Result Date: 06/01/2017 CLINICAL DATA:  83 year old female with shortness of breath. EXAM: CHEST  2 VIEW COMPARISON:  Chest radiograph dated 02/14/2017 FINDINGS: There is mild hyperinflation of the lungs with flattening of the diaphragms. Minimal bibasilar atelectasis/ scarring noted. No focal consolidation, pleural effusion, or pneumothorax. The cardiac silhouette is within normal limits. The aorta is  tortuous. There is atherosclerotic calcification of the aortic arch. The aortic arch is mildly prominent and appears aneurysmal. CT may provide better evaluation if clinically indicated. There is osteopenia with degenerative changes of the spine. No acute osseous pathology. IMPRESSION: 1. No acute cardiopulmonary process. 2. A prominent aortic arch. Electronically Signed   By: Anner Crete M.D.   On: 06/01/2017 00:24   Ct Angio Chest Pe W And/or Wo Contrast  Result Date: 06/01/2017 CLINICAL DATA:  83 year old female with shortness of breath. EXAM: CT ANGIOGRAPHY CHEST WITH CONTRAST TECHNIQUE: Multidetector CT imaging of the chest was performed using the standard protocol during bolus administration of intravenous contrast. Multiplanar CT image reconstructions and MIPs were obtained to evaluate the vascular anatomy. CONTRAST:  100 cc Isovue 370 COMPARISON:  Chest radiograph dated 05/31/2017 FINDINGS: Cardiovascular: Mild cardiomegaly. No pericardial effusion. There is moderate atherosclerotic calcification of the thoracic aorta. The thoracic aorta is mildly torturous. No aneurysmal dilatation. Evaluation of the aorta is limited due to suboptimal opacification and timing of the contrast. There is no CT evidence of pulmonary embolism. Mediastinum/Nodes: No hilar adenopathy. Subcarinal lymph node measures 15 mm in short axis. The esophagus is grossly unremarkable. Lungs/Pleura: There are minimal bibasilar atelectatic changes. There is no focal consolidation, pleural effusion, or pneumothorax. The central airways are patent. Upper Abdomen: The visualized upper abdomen is unremarkable. Musculoskeletal: There is degenerative changes of the spine and osteopenia. No acute osseous pathology. Review of the MIP images confirms the above findings. IMPRESSION: 1. No acute intrathoracic pathology. No CT evidence of pulmonary embolism. 2. Mild cardiomegaly. 3.  Aortic Atherosclerosis (ICD10-I70.0). Electronically Signed    By: Anner Crete M.D.   On: 06/01/2017 02:03    Procedures Procedures (including critical care time)  Medications Ordered in ED Medications - No data to display   Initial Impression / Assessment and Plan / ED Course  I have reviewed the triage vital signs and the nursing notes.  Pertinent labs & imaging results that were available during my care of the patient were reviewed by me and considered in my medical decision making (see chart for details).     Episode of shortness of breath that woke her from  sleep now resolved. Episode of transient tachycardia possibly witnessed by EMS but no monitor strip and place. Patient denies any chest pain or shortness of breath. She feels back to baseline.  EKG is normal sinus rhythm. Her lungs are clear. No wheezing. No peripheral edema.  Chest x-rays negative. Low suspicion for pneumonia or CHF. Troponin negative.  Patient with normal work of breathing, no hypoxia. States she feels back to baseline. Son at bedside states she is at her baseline and he isn't sure what happened then she's never had an episode like this in the past.  Second troponin is minimally elevated at 0.04. Patient continues to feel well. No chest pain and no further shortness of breath. Doubt that this elevation is significant, but still may represent anginal equivalent.. Discussed with Dr. Bonner Puna of the hospitalist service who agrees and does not feel admission is indicated as patient is back to baseline and asymptomatic. She is likely not a candidate for aggressive intervention but family would still like to pursue evaluation.  Will Repeat troponin.  Troponin unchanged at 0.04.  Patient denies symptoms. Concern for anginal equivalent persists. D/w Dr. Carles Collet who will evaluate patient in the ED.  Final diagnoses:  Dyspnea, unspecified type  Elevated troponin    New Prescriptions New Prescriptions   No medications on file     Ezequiel Essex, MD 06/01/17 [phone]

## 2017-06-01 NOTE — ED Notes (Signed)
Pt ambulated well, steady gait with Sargun Rummell, stated no SOB

## 2017-06-01 NOTE — Progress Notes (Signed)
Consult received, patient early in her initial presentation and workup for chest pain. From chart review 83 years old with history of dementia, DNR status.  She is a very poor cath candidate, and would not anticipate invasive management based on current information but we will follow up the rest of the workup and her clinical course. If recurrent chest pain or rising trend in troponin would start full dose anticoagulation. Really until further information returns we do not have recs beyond current medical therapy for today. Full consult for tomorrow once her EKG and enzymes have completed cycling and her echo is completed. At most would anticipate medical management for her if this indeed is an ACS presentaation.    [name] Abts MD

## 2017-06-01 NOTE — ED Notes (Signed)
Patient transported to X-ray 

## 2017-06-01 NOTE — Progress Notes (Signed)
Called by RN due to pt c/o chest tightness with BP 195/80  I went to evaluate patient.  Pt laying comfortably in bed.  She denied any chest discomfort or sob or dizziness.  I rechecked BP-->195/70--HR 70--sat 97% RA  -D/c florinef -hydralazine 5 mg IV q 6 prn SBP >180 -repeat troponin in am -start amlodipine 2.5 mg daily -suspect pt may have uncontrolled BP which may be contributing to intermittent chest discomfort--she is not on any agents at home  DTat

## 2017-06-01 NOTE — Progress Notes (Signed)
*  PRELIMINARY RESULTS* Echocardiogram 2D Echocardiogram has been performed.  Leavy Cella 06/01/2017, 4:13 PM

## 2017-06-01 NOTE — ED Notes (Addendum)
Report given to Audrea Muscat RN, pt going to room 329

## 2017-06-01 NOTE — ED Notes (Signed)
CRITICAL VALUE ALERT  Critical Value:  Troponin - 0.04  Date & Time Notied:  06/01/2017 - 0330  Provider Notified: Dr Wyvonnia Dusky  Orders Received/Actions taken: n/a

## 2017-06-01 NOTE — H&P (Signed)
3     History and Physical  [name] [name] GYI:[id number] DOB: Dec 27, 1917 DOA: 05/31/2017   PCP: Antionette Fairy, PA-C   Patient coming from: Home  Chief Complaint: dyspnea  HPI:  [name] [name] is a 83 y.o. female with medical history of skin cancer, HTN, hypothyroidism, dementia presented with sob that woke her up from sleep in the evening of 05/31/17.  She got up to a chair in the kitchen where she continued to sob.  This has never happened before. Apparently, when EMS arrived,  the patient was found to be tachycardic with HR in 150s.  Unfortunately, there is no documented rhythm strip.  She states that she had some chest tightness with shortness of breath that subsided one her breathing was improved. The patient states that she wakes up almost every early morning with some dyspnea and usually has to sit up on the edge the bed for a few minutes before improvement. In addition, the patient has been complaining of some dyspnea on exertion and intermittent chest discomfort with exertion. She states that this "has been going on for a while".  She denies any worsening lower extremity edema or increasing abdominal girth. The patient has never smoked. Patient denies fevers, chills, headache,  nausea, vomiting, diarrhea, abdominal pain, dysuria, hematuria, hematochezia, and melena.    In the ED, the patient is afebrile and hemodynamically stable.  She has remained in sinus rhythm with HR 50-70 through her ED stay with lowest saturation to be 93% on RA.  The patient had troponin 0.04 in the emergency department. BMP showed potassium 3.4. CBC was unremarkable. BNP was 281. EKG shows sinus rhythm with nonspecific T-wave changes.  Assessment/Plan: Dyspnea on exertion with chest pain -Concerned about angina equivalent -Given the patient's age, not sure that she is a candidate for aggressive interventions -However, patient's family would prefer for evaluation -Consult cardiology -Echocardiogram -06/01/2017 CT  angio chest--negative pulmonary embolus, bibasilar atelectasis, no consolidations -Monitor on telemetry--suspicion is low for dysrhythmia; however, EMS noted her heart rate in the 150s upon arrival -Patient has remained in sinus rhythm 50-70 in the emergency department -Check TSH -Continue aspirin  Hypothyroidism -Continue Synthroid  Hyperlipidemia -Continue Zocor -Check lipid panel  Dementia without behavioral disturbance -Continue Aricept  Hypokalemia -Replete -Check magnesium  Elevated troponin -Certainly, the patient may have underlying coronary disease, but doubt she is a candidate for aggressive intervention -Echocardiogram -not ACS        Past Medical History:  Diagnosis Date  . Cancer ([place])    skin  . Chronic back pain   . Chronic hip pain   . Chronic ulcer of left leg ([place])   . Coronary artery disease   . Dementia   . Hypertension   . Hypothyroidism   . Seizures ([place])   . Urinary incontinence due to cognitive impairment    Past Surgical History:  Procedure Laterality Date  . ABDOMINAL HYSTERECTOMY    . lumbar back surgery     Social History:  reports that she has never smoked. She has never used smokeless tobacco. She reports that she does not drink alcohol or use drugs.   Family History  Problem Relation Age of Onset  . Cancer Sister      Allergies  Allergen Reactions  . Feldene [Piroxicam] Rash     Prior to Admission medications   Medication Sig Start Date End Date Taking? Authorizing Provider  aspirin EC 81 MG tablet Take 81 mg by mouth daily.    [provider]  Calcium Carbonate-Vitamin D (CALCIUM 600 + D PO) Take 1 tablet by mouth daily.     [provider]  Cyanocobalamin (VITAMIN B-12) 2500 MCG SUBL Place 1 tablet under the tongue daily.    [provider]  donepezil (ARICEPT) 5 MG tablet Take 5 mg by mouth at bedtime.    [provider]  fludrocortisone (FLORINEF) 0.1 MG tablet Take 0.1 mg by  mouth daily.     [provider]  HYDROcodone-acetaminophen (NORCO) 10-325 MG per tablet Take 1 tablet by mouth every 6 (six) hours as needed for moderate pain. For pain 05/04/15   [provider]  levocetirizine (XYZAL) 2.5 MG/5ML solution Take 5 mLs by mouth every evening.  04/24/15   [provider]  levothyroxine (SYNTHROID, LEVOTHROID) 75 MCG tablet Take 75 mcg by mouth daily before breakfast.    [provider]  simvastatin (ZOCOR) 20 MG tablet Take 20 mg by mouth at bedtime.    [provider]  topiramate (TOPAMAX) 100 MG tablet Take 1 tablet (100 mg total) by mouth 2 (two) times daily. 06/03/15   Nat Christen, MD  vitamin C (ASCORBIC ACID) 500 MG tablet Take 500 mg by mouth daily.    [provider]    Review of Systems:  Constitutional:  No weight loss, night sweats, Fevers, chills, fatigue.  Head&Eyes: No headache.  No vision loss.  No eye pain or scotoma ENT:  No Difficulty swallowing,Tooth/dental problems,Sore throat,  No ear ache, post nasal drip,  Cardio-vascular:  No chest pain, Orthopnea, PND, swelling in lower extremities,  dizziness, palpitations  GI:  No  abdominal pain, nausea, vomiting, diarrhea, loss of appetite, hematochezia, melena, heartburn, indigestion, Resp:  No No cough. No coughing up of blood .No wheezing.No chest wall deformity  Skin:  no rash or lesions.  GU:  no dysuria, change in color of urine, no urgency or frequency. No flank pain.  Musculoskeletal:  No joint pain or swelling. No decreased range of motion. No back pain.  Psych:  No change in mood or affect. No depression or anxiety. Neurologic: No headache, no dysesthesia, no focal weakness, no vision loss. No syncope  Physical Exam: Vitals:   06/01/17 0400 06/01/17 0430 06/01/17 0530 06/01/17 0630  BP: (!) 166/72 (!) 148/64 (!) 97/58 132/78  Pulse: (!) 54 (!) 55 (!) 58 (!) 56  Resp:      Temp:      TempSrc:      SpO2: 98% 97% 97% 96%    Weight:      Height:       General:  A&O x 2, NAD, nontoxic, pleasant/cooperative Head/Eye: No conjunctival hemorrhage, no icterus, [place]/AT, No nystagmus ENT:  No icterus,  No thrush, good dentition, no pharyngeal exudate Neck:  No masses, no lymphadenpathy, no bruits, no JVD CV:  RRR, no rub, no gallop, no S3 Lung:  CTAB, good air movement, no wheeze, no rhonchi Abdomen: soft/NT, +BS, nondistended, no peritoneal signs Ext: No cyanosis, No rashes, No petechiae, No lymphangitis, trace LE edema Neuro: CNII-XII intact, strength 4/5 in bilateral upper and lower extremities, no dysmetria  Labs on Admission:  Basic Metabolic Panel:  Recent Labs Lab 05/31/17 2342  NA 141  K 3.4*  CL 110  CO2 23  GLUCOSE 104*  BUN 18  CREATININE 0.88  CALCIUM 9.6   Liver Function Tests: No results for input(s): AST, ALT, ALKPHOS, BILITOT, PROT, ALBUMIN in the last 168 hours. No results for input(s): LIPASE,  AMYLASE in the last 168 hours. No results for input(s): AMMONIA in the last 168 hours. CBC:  Recent Labs Lab 05/31/17 2342  WBC 6.2  NEUTROABS 3.2  HGB 13.0  HCT 38.7  MCV 97.2  PLT 157   Coagulation Profile: No results for input(s): INR, PROTIME in the last 168 hours. Cardiac Enzymes:  Recent Labs Lab 05/31/17 2342 06/01/17 0246 06/01/17 0548  TROPONINI <0.03 0.04* 0.04*   BNP: Invalid input(s): POCBNP CBG: No results for input(s): GLUCAP in the last 168 hours. Urine analysis:    Component Value Date/Time   COLORURINE STRAW (A) 02/14/2017 1600   APPEARANCEUR CLEAR 02/14/2017 1600   LABSPEC 1.006 02/14/2017 1600   PHURINE 7.0 02/14/2017 1600   GLUCOSEU NEGATIVE 02/14/2017 1600   HGBUR NEGATIVE 02/14/2017 1600   BILIRUBINUR NEGATIVE 02/14/2017 1600   KETONESUR NEGATIVE 02/14/2017 1600   PROTEINUR NEGATIVE 02/14/2017 1600   UROBILINOGEN 0.2 06/03/2015 1415   NITRITE NEGATIVE 02/14/2017 1600   LEUKOCYTESUR NEGATIVE 02/14/2017 1600   Sepsis  Labs: @LABRCNTIP (procalcitonin:4,lacticidven:4) )No results found for this or any previous visit (from the past 240 hour(s)).   Radiological Exams on Admission: Dg Chest 2 View  Result Date: 06/01/2017 CLINICAL DATA:  83 year old female with shortness of breath. EXAM: CHEST  2 VIEW COMPARISON:  Chest radiograph dated 02/14/2017 FINDINGS: There is mild hyperinflation of the lungs with flattening of the diaphragms. Minimal bibasilar atelectasis/ scarring noted. No focal consolidation, pleural effusion, or pneumothorax. The cardiac silhouette is within normal limits. The aorta is tortuous. There is atherosclerotic calcification of the aortic arch. The aortic arch is mildly prominent and appears aneurysmal. CT may provide better evaluation if clinically indicated. There is osteopenia with degenerative changes of the spine. No acute osseous pathology. IMPRESSION: 1. No acute cardiopulmonary process. 2. A prominent aortic arch. Electronically Signed   By: Anner Crete M.D.   On: 06/01/2017 00:24   Ct Angio Chest Pe W And/or Wo Contrast  Result Date: 06/01/2017 CLINICAL DATA:  83 year old female with shortness of breath. EXAM: CT ANGIOGRAPHY CHEST WITH CONTRAST TECHNIQUE: Multidetector CT imaging of the chest was performed using the standard protocol during bolus administration of intravenous contrast. Multiplanar CT image reconstructions and MIPs were obtained to evaluate the vascular anatomy. CONTRAST:  100 cc Isovue 370 COMPARISON:  Chest radiograph dated 05/31/2017 FINDINGS: Cardiovascular: Mild cardiomegaly. No pericardial effusion. There is moderate atherosclerotic calcification of the thoracic aorta. The thoracic aorta is mildly torturous. No aneurysmal dilatation. Evaluation of the aorta is limited due to suboptimal opacification and timing of the contrast. There is no CT evidence of pulmonary embolism. Mediastinum/Nodes: No hilar adenopathy. Subcarinal lymph node measures 15 mm in short axis. The  esophagus is grossly unremarkable. Lungs/Pleura: There are minimal bibasilar atelectatic changes. There is no focal consolidation, pleural effusion, or pneumothorax. The central airways are patent. Upper Abdomen: The visualized upper abdomen is unremarkable. Musculoskeletal: There is degenerative changes of the spine and osteopenia. No acute osseous pathology. Review of the MIP images confirms the above findings. IMPRESSION: 1. No acute intrathoracic pathology. No CT evidence of pulmonary embolism. 2. Mild cardiomegaly. 3.  Aortic Atherosclerosis (ICD10-I70.0). Electronically Signed   By: Anner Crete M.D.   On: 06/01/2017 02:03    EKG: Independently reviewed. Sinus, nonspecific T-wave changes    Time spent:60 minutes Code Status:   FULL Family Communication:  No Family at bedside Disposition Plan: expect 1 day hospitalization Consults called: none DVT Prophylaxis: [place] Lovenox  Javani Spratt, DO  [facility] Pager [phone]  If 7PM-7AM, please contact night-coverage [email or web address] Password [facility] 06/01/2017, 7:44 AM

## 2017-06-01 NOTE — ED Notes (Signed)
Attempted to call report

## 2017-06-02 DIAGNOSIS — E876 Hypokalemia: Secondary | ICD-10-CM | POA: Diagnosis not present

## 2017-06-02 DIAGNOSIS — E784 Other hyperlipidemia: Secondary | ICD-10-CM | POA: Diagnosis not present

## 2017-06-02 DIAGNOSIS — R748 Abnormal levels of other serum enzymes: Secondary | ICD-10-CM | POA: Diagnosis not present

## 2017-06-02 DIAGNOSIS — R0789 Other chest pain: Secondary | ICD-10-CM

## 2017-06-02 DIAGNOSIS — F0390 Unspecified dementia without behavioral disturbance: Secondary | ICD-10-CM | POA: Diagnosis not present

## 2017-06-02 DIAGNOSIS — R0600 Dyspnea, unspecified: Secondary | ICD-10-CM | POA: Diagnosis not present

## 2017-06-02 LAB — BASIC METABOLIC PANEL
ANION GAP: 9 (ref 5–15)
BUN: 13 mg/dL (ref 6–20)
CHLORIDE: 111 mmol/L (ref 101–111)
CO2: 20 mmol/L — AB (ref 22–32)
CREATININE: 0.77 mg/dL (ref 0.44–1.00)
Calcium: 9.9 mg/dL (ref 8.9–10.3)
GFR calc non Af Amer: >60 mL/min (ref >60)
Glucose, Bld: 137 mg/dL — ABNORMAL HIGH (ref 65–99)
POTASSIUM: 3.7 mmol/L (ref 3.5–5.1)
Sodium: 140 mmol/L (ref 135–145)

## 2017-06-02 LAB — HEMOGLOBIN A1C
HEMOGLOBIN A1C: 5.6 % (ref 4.8–5.6)
Mean Plasma Glucose: 114 mg/dL

## 2017-06-02 LAB — TROPONIN I

## 2017-06-02 MED ORDER — AMLODIPINE BESYLATE 2.5 MG PO TABS: 2.5000 mg | ORAL_TABLET | Freq: Every day | ORAL | 1 refills | Status: DC

## 2017-06-02 NOTE — Care Management Note (Signed)
Case Management Note  Patient Details  Name: [name] [name] MRN: [id number] Date of Birth: 1917-12-25  Subjective/Objective:                  Admitted r/o CP. Pt is from home, lives with her son. She reports being independent with bathing and dressing. Pt uses a cane with ambulation. She says her family is with her 24/7 and provide transportation to MD appointments. Her son manages her finances. She communicates no needs.   Action/Plan: DC home today. No CM needs.   Expected Discharge Date:  06/02/17               Expected Discharge Plan:  Home/Self Care  In-House Referral:  NA  Discharge planning Services  CM Consult  Post Acute Care Choice:  NA Choice offered to:  NA  Status of Service:  Completed, signed off  Sherald Barge, RN 06/02/2017, 10:05 AM

## 2017-06-02 NOTE — Discharge Summary (Signed)
Physician Discharge Summary  [name] [name] DDU:[id number] DOB: 08-23-1918 DOA: 05/31/2017  PCP: Antionette Fairy, PA-C  Admit date: 05/31/2017 Discharge date: 06/02/2017  Admitted From: Home Disposition:  Home  Recommendations for Outpatient Follow-up:  1. Follow up with PCP in 1-2 weeks 2. Please obtain BMP/CBC in one week    Discharge Condition: Stable CODE STATUS: DNR Diet recommendation: Regular   Brief/Interim Summary: 83 y.o. female with medical history of skin cancer, HTN, hypothyroidism, dementia presented with sob that woke her up from sleep in the evening of 05/31/17.  She got up to a chair in the kitchen where she continued to sob. Apparently, when EMS arrived,  the patient was found to be tachycardic with HR in 150s.  Unfortunately, there is no documented rhythm strip.  She states that she had some chest tightness with shortness of breath that subsided one her breathing was improved. The patient states that she wakes up almost every early morning with some dyspnea and usually has to sit up on the edge the bed for a few minutes before improvement. In addition, the patient has been complaining of some dyspnea on exertion and intermittent chest discomfort with exertion. She states that this "has been going on for a while".  She denies any worsening lower extremity edema or increasing abdominal girth. The patient has never smoked. The patient was admitted under observation to monitor her cardiac condition. Her troponins were flat. Cardiology was consulted and did not feel the patient needed any further cardiac workup. The patient was noted to have significant hypertension per she was started on amlodipine 2.5 mg daily with improvement. In retrospect, the patient may have had uncontrolled hypertension which may have resulted in her intermittent chest discomfort or shortness of breath.  Discharge Diagnoses:  Dyspnea on exertion with chest pain -Stable on RA -Given the patient's age, not  sure that she is a candidate for aggressive interventions -However, patient's family would prefer for evaluation -cardiology consulted-->Given her advanced age, dementia and DNR status in the absence of high risk findings or ongoing symptoms would not consider ischemic testing; she is in general a poor cath candidate -Echocardiogram-EF 65-70%, grade 1 DD, no WMA, trivial TR/MR -06/01/2017 CT angio chest--negative pulmonary embolus, bibasilar atelectasis, no consolidations -Monitor on telemetry--no dysrhythmias on telemetry -Patient has remained in sinus rhythm 50-70 in the emergency department -Check TSH--0.386 -Continue aspirin  Essential hypertension -Discharge home with amlodipine 2.5 mg daily -d/c florinef  Hypothyroidism -Continue Synthroid  Hyperlipidemia -Continue Zocor -Check lipid panel--LDL 64  Dementia without behavioral disturbance -Continue Aricept  Hypokalemia -Repleted -Check magnesium--2.0  Elevated troponin -Certainly, the patient may have underlying coronary disease, but she is a candidate for aggressive intervention -Echocardiogram--as discussed above -not ACS  Discharge Instructions   Allergies as of 06/02/2017      Reactions   Feldene [piroxicam] Rash   Pt denies allergy to this medication      Medication List    STOP taking these medications   fludrocortisone 0.1 MG tablet Commonly known as:  FLORINEF     TAKE these medications   acetaminophen 325 MG tablet Commonly known as:  TYLENOL Take 650 mg by mouth every 6 (six) hours as needed for headache.   amLODipine 2.5 MG tablet Commonly known as:  NORVASC Take 1 tablet (2.5 mg total) by mouth daily. Start taking on:  06/03/2017   aspirin EC 81 MG tablet Take 81 mg by mouth daily.   CALCIUM 600 + D PO Take 1 tablet by  mouth daily.   donepezil 10 MG tablet Commonly known as:  ARICEPT Take 10 mg by mouth at bedtime.   HYDROcodone-acetaminophen 10-325 MG tablet Commonly known as:   NORCO Take 1 tablet by mouth every 6 (six) hours as needed for moderate pain. For pain   levothyroxine 75 MCG tablet Commonly known as:  SYNTHROID, LEVOTHROID Take 75 mcg by mouth daily before breakfast.   multivitamin with minerals Tabs tablet Take 1 tablet by mouth daily.   omeprazole 20 MG capsule Commonly known as:  PRILOSEC Take 20 mg by mouth every morning.   simvastatin 20 MG tablet Commonly known as:  ZOCOR Take 20 mg by mouth at bedtime.   topiramate 100 MG tablet Commonly known as:  TOPAMAX Take 1 tablet (100 mg total) by mouth 2 (two) times daily.   Vitamin B-12 2500 MCG Subl Place 1 tablet under the tongue daily.   vitamin C 500 MG tablet Commonly known as:  ASCORBIC ACID Take 500 mg by mouth daily.       Allergies  Allergen Reactions  . Feldene [Piroxicam] Rash    Pt denies allergy to this medication    Consultations:  cardiology   Procedures/Studies: Dg Chest 2 View  Result Date: 06/01/2017 CLINICAL DATA:  82 year old female with shortness of breath. EXAM: CHEST  2 VIEW COMPARISON:  Chest radiograph dated 02/14/2017 FINDINGS: There is mild hyperinflation of the lungs with flattening of the diaphragms. Minimal bibasilar atelectasis/ scarring noted. No focal consolidation, pleural effusion, or pneumothorax. The cardiac silhouette is within normal limits. The aorta is tortuous. There is atherosclerotic calcification of the aortic arch. The aortic arch is mildly prominent and appears aneurysmal. CT may provide better evaluation if clinically indicated. There is osteopenia with degenerative changes of the spine. No acute osseous pathology. IMPRESSION: 1. No acute cardiopulmonary process. 2. A prominent aortic arch. Electronically Signed   By: Anner Crete M.D.   On: 06/01/2017 00:24   Ct Angio Chest Pe W And/or Wo Contrast  Result Date: 06/01/2017 CLINICAL DATA:  83 year old female with shortness of breath. EXAM: CT ANGIOGRAPHY CHEST WITH CONTRAST  TECHNIQUE: Multidetector CT imaging of the chest was performed using the standard protocol during bolus administration of intravenous contrast. Multiplanar CT image reconstructions and MIPs were obtained to evaluate the vascular anatomy. CONTRAST:  100 cc Isovue 370 COMPARISON:  Chest radiograph dated 05/31/2017 FINDINGS: Cardiovascular: Mild cardiomegaly. No pericardial effusion. There is moderate atherosclerotic calcification of the thoracic aorta. The thoracic aorta is mildly torturous. No aneurysmal dilatation. Evaluation of the aorta is limited due to suboptimal opacification and timing of the contrast. There is no CT evidence of pulmonary embolism. Mediastinum/Nodes: No hilar adenopathy. Subcarinal lymph node measures 15 mm in short axis. The esophagus is grossly unremarkable. Lungs/Pleura: There are minimal bibasilar atelectatic changes. There is no focal consolidation, pleural effusion, or pneumothorax. The central airways are patent. Upper Abdomen: The visualized upper abdomen is unremarkable. Musculoskeletal: There is degenerative changes of the spine and osteopenia. No acute osseous pathology. Review of the MIP images confirms the above findings. IMPRESSION: 1. No acute intrathoracic pathology. No CT evidence of pulmonary embolism. 2. Mild cardiomegaly. 3.  Aortic Atherosclerosis (ICD10-I70.0). Electronically Signed   By: Anner Crete M.D.   On: 06/01/2017 02:03         Discharge Exam: Vitals:   06/01/17 2143 06/02/17 0458  BP: (!) 150/68 (!) 150/62  Pulse:  73  Resp:  18  Temp:  97.7 F (36.5 C)   Vitals:  06/01/17 1713 06/01/17 2025 06/01/17 2143 06/02/17 0458  BP: (!) 174/90 (!) 216/70 (!) 150/68 (!) 150/62  Pulse:  63  73  Resp:  20  18  Temp:  98 F (36.7 C)  97.7 F (36.5 C)  TempSrc:  Oral  Oral  SpO2:  97%  96%  Weight:      Height:        General: Pt is alert, awake, not in acute distress Cardiovascular: RRR, S1/S2 +, no rubs, no gallops Respiratory: CTA  bilaterally, no wheezing, no rhonchi Abdominal: Soft, NT, ND, bowel sounds + Extremities: no edema, no cyanosis   The results of significant diagnostics from this hospitalization (including imaging, microbiology, ancillary and laboratory) are listed below for reference.    Significant Diagnostic Studies: Dg Chest 2 View  Result Date: 06/01/2017 CLINICAL DATA:  83 year old female with shortness of breath. EXAM: CHEST  2 VIEW COMPARISON:  Chest radiograph dated 02/14/2017 FINDINGS: There is mild hyperinflation of the lungs with flattening of the diaphragms. Minimal bibasilar atelectasis/ scarring noted. No focal consolidation, pleural effusion, or pneumothorax. The cardiac silhouette is within normal limits. The aorta is tortuous. There is atherosclerotic calcification of the aortic arch. The aortic arch is mildly prominent and appears aneurysmal. CT may provide better evaluation if clinically indicated. There is osteopenia with degenerative changes of the spine. No acute osseous pathology. IMPRESSION: 1. No acute cardiopulmonary process. 2. A prominent aortic arch. Electronically Signed   By: Anner Crete M.D.   On: 06/01/2017 00:24   Ct Angio Chest Pe W And/or Wo Contrast  Result Date: 06/01/2017 CLINICAL DATA:  83 year old female with shortness of breath. EXAM: CT ANGIOGRAPHY CHEST WITH CONTRAST TECHNIQUE: Multidetector CT imaging of the chest was performed using the standard protocol during bolus administration of intravenous contrast. Multiplanar CT image reconstructions and MIPs were obtained to evaluate the vascular anatomy. CONTRAST:  100 cc Isovue 370 COMPARISON:  Chest radiograph dated 05/31/2017 FINDINGS: Cardiovascular: Mild cardiomegaly. No pericardial effusion. There is moderate atherosclerotic calcification of the thoracic aorta. The thoracic aorta is mildly torturous. No aneurysmal dilatation. Evaluation of the aorta is limited due to suboptimal opacification and timing of the contrast.  There is no CT evidence of pulmonary embolism. Mediastinum/Nodes: No hilar adenopathy. Subcarinal lymph node measures 15 mm in short axis. The esophagus is grossly unremarkable. Lungs/Pleura: There are minimal bibasilar atelectatic changes. There is no focal consolidation, pleural effusion, or pneumothorax. The central airways are patent. Upper Abdomen: The visualized upper abdomen is unremarkable. Musculoskeletal: There is degenerative changes of the spine and osteopenia. No acute osseous pathology. Review of the MIP images confirms the above findings. IMPRESSION: 1. No acute intrathoracic pathology. No CT evidence of pulmonary embolism. 2. Mild cardiomegaly. 3.  Aortic Atherosclerosis (ICD10-I70.0). Electronically Signed   By: Anner Crete M.D.   On: 06/01/2017 02:03     Microbiology: No results found for this or any previous visit (from the past 240 hour(s)).   Labs: Basic Metabolic Panel:  Recent Labs Lab 05/31/17 2342 06/01/17 1152 06/02/17 0555  NA 141  --  140  K 3.4*  --  3.7  CL 110  --  111  CO2 23  --  20*  GLUCOSE 104*  --  137*  BUN 18  --  13  CREATININE 0.88  --  0.77  CALCIUM 9.6  --  9.9  MG  --  2.0  --    Liver Function Tests: No results for input(s): AST, ALT, ALKPHOS, BILITOT, PROT,  ALBUMIN in the last 168 hours. No results for input(s): LIPASE, AMYLASE in the last 168 hours. No results for input(s): AMMONIA in the last 168 hours. CBC:  Recent Labs Lab 05/31/17 2342  WBC 6.2  NEUTROABS 3.2  HGB 13.0  HCT 38.7  MCV 97.2  PLT 157   Cardiac Enzymes:  Recent Labs Lab 05/31/17 2342 06/01/17 0246 06/01/17 0548 06/01/17 1152 06/02/17 0555  TROPONINI <0.03 0.04* 0.04* 0.03* <0.03   BNP: Invalid input(s): POCBNP CBG: No results for input(s): GLUCAP in the last 168 hours.  Time coordinating discharge:  Greater than 30 minutes  Signed:  Johneric Mcfadden, DO [facility] Pager: [phone] 06/02/2017, 9:48 AM

## 2017-06-02 NOTE — Progress Notes (Signed)
IV removed. Catheter was intact upon removal.  Site clean, dry, and intact with no redness or swelling.  Discharge instructions reviewed with son with whom patient resides.   Son and patient verbalized understanding of instructions.  Patient left floor with tech via wheelchair.  Pt was in stable condition upon discharge.

## 2017-06-02 NOTE — Care Management Obs Status (Signed)
[facility] NOTIFICATION   Patient Details  Name: [name] [name] MRN: [id number] Date of Birth: 06-26-1918   Medicare Observation Status Notification Given:  Yes    Sherald Barge, RN 06/02/2017, 9:22 AM

## 2017-06-02 NOTE — Consult Note (Signed)
Cardiology Consultation:   Patient ID: [name] [name]; [id number]; 06-29-1918   Admit date: 05/31/2017 Date of Consult: 06/02/2017  Primary Care Provider: Vesta Mixer Primary Cardiologist: Harl Bowie MD    Patient Profile:   [name] [name] is a 83 y.o. female with a hx of HTN, dementia, CAD, hypothyroid, chronic back pain who is being seen today for the evaluation of chest pain at the request of Dr. Carles Collet   History of Present Illness:   Ms. Salyers presented to the ER with complaints of dyspnea and chest pain which awakened her from sleep.EMS was called and per H&P, patients HR was elevated at 150 bpm, and was brought to ER.    On arrival, BP 115/98, HR 75 bpm. She was afebrile. Troponin 0.03, and 0.03 respectively. EKG demonstrated NSR with LAFB. Pertinent labs: K+ 3.4, Glucose 104. BNP 281.0. CT scan did not demonstrate evidence of intrathoracic abnormalities, no PE. CXR negative for CHF or pneumonia. She was treated with ASA and admitted to rule out ACS. Echocardiogram has been completed, revealing normal EF of 50%-35%, Grade 1 diastolic dysfunction, with mild AoV regurgitation, and moderately calcified MV annulus. .  We are asked for cardiology recommendations.  Patient is a poor historian and history is obtained from current and past medical records.   Dr. Harl Bowie has written progress note on 06/01/2017. Please see that note as well.  Since admission, florinef has been discontinued by Dr. Carles Collet, she was started on amlodipine, 2.5 mg daily, as he felt HTN was contributing to chest discomfort.   Past Medical History:  Diagnosis Date  . Cancer ([place])    skin  . Chronic back pain   . Chronic hip pain   . Chronic ulcer of left leg ([place])   . Coronary artery disease   . Dementia   . Hypertension   . Hypothyroidism   . Seizures ([place])   . Urinary incontinence due to cognitive impairment     Past Surgical History:  Procedure Laterality Date  . ABDOMINAL HYSTERECTOMY    . lumbar back  surgery       Inpatient Medications: Scheduled Meds: . amLODipine  2.5 mg Oral Daily  . aspirin EC  81 mg Oral Daily  . donepezil  10 mg Oral QHS  . enoxaparin (LOVENOX) injection  30 mg Subcutaneous Q24H  . levothyroxine  75 mcg Oral QAC breakfast  . loratadine  5 mg Oral q1800  . simvastatin  20 mg Oral QHS  . topiramate  100 mg Oral BID  . vitamin C  500 mg Oral Daily   Continuous Infusions:  PRN Meds: acetaminophen **OR** acetaminophen, hydrALAZINE, HYDROcodone-acetaminophen, ondansetron **OR** ondansetron (ZOFRAN) IV  Allergies:    Allergies  Allergen Reactions  . Feldene [Piroxicam] Rash    Pt denies allergy to this medication    Social History:   Social History   Social History  . Marital status: Widowed    Spouse name: N/A  . Number of children: N/A  . Years of education: N/A   Occupational History  . Not on file.   Social History Main Topics  . Smoking status: Never Smoker  . Smokeless tobacco: Never Used  . Alcohol use No  . Drug use: No  . Sexual activity: Not on file   Other Topics Concern  . Not on file   Social History Narrative  . No narrative on file    Family History:   The patient's family history includes Cancer in her sister.  ROS:  Please see the history of present illness.  ROS  All other ROS reviewed and negative.     Physical Exam/Data:   Vitals:   06/01/17 1713 06/01/17 2025 06/01/17 2143 06/02/17 0458  BP: (!) 174/90 (!) 216/70 (!) 150/68 (!) 150/62  Pulse:  63  73  Resp:  20  18  Temp:  98 F (36.7 C)  97.7 F (36.5 C)  TempSrc:  Oral  Oral  SpO2:  97%  96%  Weight:      Height:        Intake/Output Summary (Last 24 hours) at 06/02/17 0803 Last data filed at 06/02/17 0459  Gross per 24 hour  Intake                0 ml  Output              250 ml  Net             -250 ml   Filed Weights   05/31/17 2330 06/01/17 1013  Weight: 150 lb (68 kg) 132 lb 15 oz (60.3 kg)   Body mass index is 25.96 kg/m.    General:  Well nourished, well developed, in no acute distress. Poor historian  HEENT: normal Lymph: no adenopathy Neck: no JVD Endocrine:  No thryomegaly Vascular: No carotid bruits; FA pulses 2+ bilaterally without bruits  Cardiac:  normal S1, S2; RRR; 2/6 holosystolic murmur, with radiation to the carotids.  Lungs:  Clear to auscultation bilaterally, no wheezing, rhonchi or rales  Abd: soft, nontender, no hepatomegaly  Ext: no edema Musculoskeletal:  No deformities, BUE and BLE strength normal and equal Skin: warm and dry  Neuro:  CNs 2-12 intact, no focal abnormalities noted Psych:  Normal affect   EKG:  The EKG was personally reviewed and demonstrates NSR with LAFB.  Telemetry:  Telemetry was personally reviewed and demonstrates NSR with 1st degree AVB, occasional PAC, HR 66 bpm.   Relevant CV Studies: Echocardiogram 06/01/2017 Left ventricle: The cavity size was normal. Wall thickness was   increased in a pattern of mild LVH. Systolic function was   vigorous. The estimated ejection fraction was in the range of 65%   to 70%. Wall motion was normal; there were no regional wall   motion abnormalities. Doppler parameters are consistent with   abnormal left ventricular relaxation (grade 1 diastolic   dysfunction). - Aortic valve: Trileaflet; moderately calcified leaflets. There   was mild regurgitation. Mean gradient (S): 8 mm Hg. Peak gradient   (S): 17 mm Hg. Valve area (VTI): 2.86 cm^2. - Mitral valve: Moderately calcified annulus. There was trivial   regurgitation. - Right atrium: Central venous pressure (est): 3 mm Hg. - Atrial septum: No defect or patent foramen ovale was identified. - Tricuspid valve: There was trivial regurgitation. - Pulmonary arteries: Systolic pressure could not be accurately   estimated. - Pericardium, extracardiac: A prominent pericardial fat pad was   present.  Laboratory Data:  Chemistry Recent Labs Lab 05/31/17 2342 06/02/17 0555  NA  141 140  K 3.4* 3.7  CL 110 111  CO2 23 20*  GLUCOSE 104* 137*  BUN 18 13  CREATININE 0.88 0.77  CALCIUM 9.6 9.9  GFRNONAA 53* >60  GFRAA >60 >60  ANIONGAP 8 9    No results for input(s): PROT, ALBUMIN, AST, ALT, ALKPHOS, BILITOT in the last 168 hours. Hematology Recent Labs Lab 05/31/17 2342  WBC 6.2  RBC 3.98  HGB 13.0  HCT  38.7  MCV 97.2  MCH 32.7  MCHC 33.6  RDW 13.5  PLT 157   Cardiac Enzymes Recent Labs Lab 05/31/17 2342 06/01/17 0246 06/01/17 0548 06/01/17 1152 06/02/17 0555  TROPONINI <0.03 0.04* 0.04* 0.03* <0.03   No results for input(s): TROPIPOC in the last 168 hours.  BNP Recent Labs Lab 05/31/17 2342  BNP 281.0*    DDimer  Recent Labs Lab 05/31/17 2343  DDIMER 1.07*    Radiology/Studies:  Dg Chest 2 View  Result Date: 06/01/2017 CLINICAL DATA:  83 year old female with shortness of breath. EXAM: CHEST  2 VIEW COMPARISON:  Chest radiograph dated 02/14/2017 FINDINGS: There is mild hyperinflation of the lungs with flattening of the diaphragms. Minimal bibasilar atelectasis/ scarring noted. No focal consolidation, pleural effusion, or pneumothorax. The cardiac silhouette is within normal limits. The aorta is tortuous. There is atherosclerotic calcification of the aortic arch. The aortic arch is mildly prominent and appears aneurysmal. CT may provide better evaluation if clinically indicated. There is osteopenia with degenerative changes of the spine. No acute osseous pathology. IMPRESSION: 1. No acute cardiopulmonary process. 2. A prominent aortic arch. Electronically Signed   By: Anner Crete M.D.   On: 06/01/2017 00:24   Ct Angio Chest Pe W And/or Wo Contrast  Result Date: 06/01/2017 CLINICAL DATA:  83 year old female with shortness of breath. EXAM: CT ANGIOGRAPHY CHEST WITH CONTRAST TECHNIQUE: Multidetector CT imaging of the chest was performed using the standard protocol during bolus administration of intravenous contrast. Multiplanar CT image  reconstructions and MIPs were obtained to evaluate the vascular anatomy. CONTRAST:  100 cc Isovue 370 COMPARISON:  Chest radiograph dated 05/31/2017 FINDINGS: Cardiovascular: Mild cardiomegaly. No pericardial effusion. There is moderate atherosclerotic calcification of the thoracic aorta. The thoracic aorta is mildly torturous. No aneurysmal dilatation. Evaluation of the aorta is limited due to suboptimal opacification and timing of the contrast. There is no CT evidence of pulmonary embolism. Mediastinum/Nodes: No hilar adenopathy. Subcarinal lymph node measures 15 mm in short axis. The esophagus is grossly unremarkable. Lungs/Pleura: There are minimal bibasilar atelectatic changes. There is no focal consolidation, pleural effusion, or pneumothorax. The central airways are patent. Upper Abdomen: The visualized upper abdomen is unremarkable. Musculoskeletal: There is degenerative changes of the spine and osteopenia. No acute osseous pathology. Review of the MIP images confirms the above findings. IMPRESSION: 1. No acute intrathoracic pathology. No CT evidence of pulmonary embolism. 2. Mild cardiomegaly. 3.  Aortic Atherosclerosis (ICD10-I70.0). Electronically Signed   By: Anner Crete M.D.   On: 06/01/2017 02:03    Assessment and Plan:   1. Chest Pain: Comp[letely resolved today. Patient denies any further discomfort. Uncertain when or how long chest pain lasted. Troponin is overall negative . EKG does not demonstrate ACS. Review of echo does not reveal any decreased systolic function. Continue medical management. No planned ischemic testing as discussed by Dr. Harl Bowie due to age and co-morbidities.   2. Hypertension: Uncertain why she was on florinef. BP has been elevated during this admission, but now on amlodipine 2.5 mg daily wit moderate improvement. Can consider low dose metoprolol if she experiences rapid HR again. May need OP cardiac monitor if clinically indicated. Will not plan this now.   3.  Hypercholesterolemia; Continue statin therapy.   4. Hypothyroidism: Continue treatment per PCP team.        Signed, Jory Sims, NP  06/02/2017 8:03 AM   Attending note Patient seen and discussed with DNP Purcell Nails, I agree with her documentation above. 83 yo female  history of HTN, hypothryoidism, demenia, DNR status admitted with chest pain and SOB.    WBC 6.2, Hgb 13, plt 157, K 3.4, Cr 0.88, BNP 281, D-dimer 1.07,  Trop neg-->0.04-->0.04-->0.03-->neg CXR no acute process CT PE no PE, aortic atherosclerosis EKG SR, no specific ischemic changes Echo LVEF 65-70%, no WMAs, grade I diastolic dysfunction, mild AI  Barely detectable mild flat troponin elevation not consistent with ACS, EKG without specific ischemic changes. Her echo actually shows a hyperdynamic LV without any wallmotion abnormalities. Do not suspect ACS at this time. Given her advanced age, dementia and DNR status in the absence of high risk findings or ongoing symptoms would not consider ischemic testing, she is in general a poor cath candidate. Reported elevated heart rate by EMS evaluation, there is no strip. Unclear if sinus tach due to her respiratory distress or possible arrhythmia. EKGs during admit SR, telemetry this admission with no arrhythmias and normal heart rates. Would not pursue further testing at this time, if recurrent episodes of tachycardia as outpatient could consider monitor. Ok to be off florinef given extreme HTN this admission, follow on low dose norvasc.   She will f/u with PA in 2 weeks. We will sign off inpatient care.   Carlyle Dolly MD

## 2017-06-15 NOTE — Progress Notes (Signed)
Cardiology Office Note   Date:  06/16/2017   ID:  [name] [name], DOB November 16, 1918, MRN [id number]  PCP:  Vesta Mixer  Cardiologist:  [facility]  Chief Complaint  Patient presents with  . Hypertension  . Coronary Artery Disease      History of Present Illness: [name] [name] is a 83 y.o. female who presents for post hospital follow up after being seen on consultation for dyspnea and chest pain. Hx of HTN, CAD and Hypothyroidism. Due to multiple co-morbidities, she was not scheduled for ischemic testing. Echo was completed and found to have normal LVEF with no WMA. Grade I diastolic dysfunction. She remained in NSR. She was discharged on amlodipine 2.5 mg daily.   She did not tolerate this when she returned home. She became listless and almost "aombie-like". Son stopped the amlodipine and she was better the day following. She has been doing well ever since.  The patient has mild dea and is very dependent on her son who cares for her.   Past Medical History:  Diagnosis Date  . Cancer ([place])    skin  . Chronic back pain   . Chronic hip pain   . Chronic ulcer of left leg ([place])   . Coronary artery disease   . Dementia   . Hypertension   . Hypothyroidism   . Seizures ([place])   . Urinary incontinence due to cognitive impairment     Past Surgical History:  Procedure Laterality Date  . ABDOMINAL HYSTERECTOMY    . lumbar back surgery       Current Outpatient Prescriptions  Medication Sig Dispense Refill  . acetaminophen (TYLENOL) 325 MG tablet Take 650 mg by mouth every 6 (six) hours as needed for headache.    Marland Kitchen aspirin EC 81 MG tablet Take 81 mg by mouth daily.    . Calcium Carbonate-Vitamin D (CALCIUM 600 + D PO) Take 1 tablet by mouth daily.     . Cyanocobalamin (VITAMIN B-12) 2500 MCG SUBL Place 1 tablet under the tongue daily.    Marland Kitchen donepezil (ARICEPT) 10 MG tablet Take 10 mg by mouth at bedtime.    Marland Kitchen HYDROcodone-acetaminophen (NORCO) 10-325 MG per tablet Take 1 tablet by mouth  every 6 (six) hours as needed for moderate pain. For pain    . levothyroxine (SYNTHROID, LEVOTHROID) 75 MCG tablet Take 75 mcg by mouth daily before breakfast.    . Multiple Vitamin (MULTIVITAMIN WITH MINERALS) TABS tablet Take 1 tablet by mouth daily.    Marland Kitchen omeprazole (PRILOSEC) 20 MG capsule Take 20 mg by mouth every morning.     . simvastatin (ZOCOR) 20 MG tablet Take 20 mg by mouth at bedtime.    . topiramate (TOPAMAX) 100 MG tablet Take 1 tablet (100 mg total) by mouth 2 (two) times daily. 60 tablet 1  . vitamin C (ASCORBIC ACID) 500 MG tablet Take 500 mg by mouth daily.     No current facility-administered medications for this visit.     Allergies:   Feldene [piroxicam]    Social History:  The patient  reports that she has never smoked. She has never used smokeless tobacco. She reports that she does not drink alcohol or use drugs.   Family History:  The patient's family history includes Cancer in her sister; Heart attack in her mother; Hypertension in her mother.    ROS: All other systems are reviewed and negative. Unless otherwise mentioned in H&P    PHYSICAL EXAM: VS:  BP 110/60  Pulse 68   Ht 5\' 2"  (1.575 m)   Wt 134 lb (60.8 kg)   SpO2 95%   BMI 24.51 kg/m  , BMI Body mass index is 24.51 kg/m. GEN: Well nourished, well developed, in no acute distress  HEENT: normal  Neck: no JVD, carotid bruits, or masses Cardiac: RRR; no murmurs, rubs, or gallops,no edema  Respiratory:  clear to auscultation bilaterally, normal work of breathing GI: soft, nontender, nondistended, + BS MS: no deformity or atrophy  Skin: warm and dry, no rash Neuro:  Strength and sensation are intact Psych: euthymic mood, full affect   Recent Labs: 02/14/2017: ALT 12 05/31/2017: B Natriuretic Peptide 281.0; Hemoglobin 13.0; Platelets 157 06/25/2017: Magnesium 2.0; TSH 0.386 06/02/2017: BUN 13; Creatinine, Ser 0.77; Potassium 3.7; Sodium 140    Lipid Panel    Component Value Date/Time   CHOL 140  June 25, 2017 1152   TRIG 78 06/25/2017 1152   HDL 60 06-25-17 1152   CHOLHDL 2.3 06-25-2017 1152   VLDL 16 06-25-2017 1152   LDLCALC 64 06/25/17 1152      Wt Readings from Last 3 Encounters:  06/16/17 134 lb (60.8 kg)  June 25, 2017 132 lb 15 oz (60.3 kg)  02/14/17 150 lb (68 kg)      Other studies Reviewed: Echocardiogram June 25, 2017 Left ventricle: The cavity size was normal. Wall thickness was   increased in a pattern of mild LVH. Systolic function was   vigorous. The estimated ejection fraction was in the range of 65%   to 70%. Wall motion was normal; there were no regional wall   motion abnormalities. Doppler parameters are consistent with   abnormal left ventricular relaxation (grade 1 diastolic   dysfunction). - Aortic valve: Trileaflet; moderately calcified leaflets. There   was mild regurgitation. Mean gradient (S): 8 mm Hg. Peak gradient   (S): 17 mm Hg. Valve area (VTI): 2.86 cm^2. - Mitral valve: Moderately calcified annulus. There was trivial   regurgitation. - Right atrium: Central venous pressure (est): 3 mm Hg. - Atrial septum: No defect or patent foramen ovale was identified. - Tricuspid valve: There was trivial regurgitation. - Pulmonary arteries: Systolic pressure could not be accurately   estimated. - Pericardium, extracardiac: A prominent pericardial fat pad was   present.  ASSESSMENT AND PLAN:  1.  Hypertension: this is noted during recent hospitalization for dyspnea Blood pressures ranged between 735 -329 mmHg systolic.  She was started on amlodipine 2.5 mg daily. She did tolerate this. This caused her to have significant fatigue and somnolence. The son stopped amlodipine. She got much better. Will not restart this. She appears stable currently  2.  CAD:  Due to age, co-morbidities, and dementia, no ischemic workup is planned. Continue to treat medically. She was ruled out for ACS during recent hospitalization.   Current medicines are reviewed at length  with the patient today.    Labs/ tests ordered today include:  Phill Myron. [place] Pugh, ANP, AACC   06/16/2017 3:24 PM    [facility] [address] [place], [place] [address] Phone: [phone]; Fax: [phone]

## 2017-06-16 ENCOUNTER — Encounter: Payer: Self-pay | Admitting: Adult Health

## 2017-06-16 ENCOUNTER — Ambulatory Visit (INDEPENDENT_AMBULATORY_CARE_PROVIDER_SITE_OTHER): Payer: Medicare Other | Admitting: Adult Health

## 2017-06-16 VITALS — BP 110/60 | HR 68 | Ht 62.0 in | Wt 134.0 lb

## 2017-06-16 DIAGNOSIS — I10 Essential (primary) hypertension: Secondary | ICD-10-CM | POA: Diagnosis not present

## 2017-06-16 DIAGNOSIS — I2510 Atherosclerotic heart disease of native coronary artery without angina pectoris: Secondary | ICD-10-CM

## 2017-06-16 NOTE — Patient Instructions (Signed)
Medication Instructions:  None  Labwork: None  Testing/Procedures: None  Follow-Up: Your physician wants you to follow-up in: 6 Month with Dr. Harl Bowie. You will receive a reminder letter in the mail two months in advance. If you don't receive a letter, please call our office to schedule the follow-up appointment.   Any Other Special Instructions Will Be Listed Below (If Applicable).     If you need a refill on your cardiac medications before your next appointment, please call your pharmacy.  Thank you for choosing [facility]!

## 2017-11-23 ENCOUNTER — Emergency Department (HOSPITAL_COMMUNITY)
Admission: EM | Admit: 2017-11-23 | Discharge: 2017-11-23 | Disposition: A | Discharge status: Home / Self Care | Payer: Medicare Other | Attending: Emergency Medicine | Admitting: Emergency Medicine

## 2017-11-23 ENCOUNTER — Encounter (HOSPITAL_COMMUNITY): Payer: Self-pay | Admitting: Emergency Medicine

## 2017-11-23 ENCOUNTER — Emergency Department (HOSPITAL_COMMUNITY): Payer: Medicare Other

## 2017-11-23 ENCOUNTER — Other Ambulatory Visit: Payer: Self-pay

## 2017-11-23 DIAGNOSIS — E860 Dehydration: Secondary | ICD-10-CM | POA: Diagnosis not present

## 2017-11-23 DIAGNOSIS — R531 Weakness: Secondary | ICD-10-CM | POA: Diagnosis not present

## 2017-11-23 DIAGNOSIS — G8929 Other chronic pain: Secondary | ICD-10-CM | POA: Diagnosis not present

## 2017-11-23 DIAGNOSIS — R05 Cough: Secondary | ICD-10-CM | POA: Diagnosis present

## 2017-11-23 DIAGNOSIS — I10 Essential (primary) hypertension: Secondary | ICD-10-CM | POA: Diagnosis not present

## 2017-11-23 DIAGNOSIS — Z7982 Long term (current) use of aspirin: Secondary | ICD-10-CM | POA: Diagnosis not present

## 2017-11-23 DIAGNOSIS — J209 Acute bronchitis, unspecified: Secondary | ICD-10-CM | POA: Diagnosis not present

## 2017-11-23 DIAGNOSIS — E039 Hypothyroidism, unspecified: Secondary | ICD-10-CM | POA: Diagnosis not present

## 2017-11-23 DIAGNOSIS — F0390 Unspecified dementia without behavioral disturbance: Secondary | ICD-10-CM | POA: Insufficient documentation

## 2017-11-23 DIAGNOSIS — Z79899 Other long term (current) drug therapy: Secondary | ICD-10-CM | POA: Diagnosis not present

## 2017-11-23 DIAGNOSIS — R600 Localized edema: Secondary | ICD-10-CM | POA: Insufficient documentation

## 2017-11-23 LAB — COMPREHENSIVE METABOLIC PANEL
ALBUMIN: 3.5 g/dL (ref 3.5–5.0)
ALK PHOS: 77 U/L (ref 38–126)
ALT: 11 U/L — ABNORMAL LOW (ref 14–54)
ANION GAP: 16 — AB (ref 5–15)
AST: 21 U/L (ref 15–41)
BILIRUBIN TOTAL: 1.3 mg/dL — AB (ref 0.3–1.2)
BUN: 25 mg/dL — ABNORMAL HIGH (ref 6–20)
CALCIUM: 9.8 mg/dL (ref 8.9–10.3)
CO2: 16 mmol/L — ABNORMAL LOW (ref 22–32)
Chloride: 108 mmol/L (ref 101–111)
Creatinine, Ser: 1.13 mg/dL — ABNORMAL HIGH (ref 0.44–1.00)
GFR calc non Af Amer: 39 mL/min — ABNORMAL LOW (ref >60)
GFR, EST AFRICAN AMERICAN: 46 mL/min — AB (ref >60)
GLUCOSE: 149 mg/dL — AB (ref 65–99)
POTASSIUM: 3.2 mmol/L — AB (ref 3.5–5.1)
SODIUM: 140 mmol/L (ref 135–145)
TOTAL PROTEIN: 7.2 g/dL (ref 6.5–8.1)

## 2017-11-23 LAB — PROTIME-INR
INR: 1.19
Prothrombin Time: 15 seconds (ref 11.4–15.2)

## 2017-11-23 LAB — CBC WITH DIFFERENTIAL/PLATELET
BASOS ABS: 0 10*3/uL (ref 0.0–0.1)
BASOS PCT: 0 %
Eosinophils Absolute: 0 10*3/uL (ref 0.0–0.7)
Eosinophils Relative: 0 %
HEMATOCRIT: 42.2 % (ref 36.0–46.0)
HEMOGLOBIN: 13.8 g/dL (ref 12.0–15.0)
LYMPHS PCT: 13 %
Lymphs Abs: 1.6 10*3/uL (ref 0.7–4.0)
MCH: 31.9 pg (ref 26.0–34.0)
MCHC: 32.7 g/dL (ref 30.0–36.0)
MCV: 97.7 fL (ref 78.0–100.0)
Monocytes Absolute: 0.8 10*3/uL (ref 0.1–1.0)
Monocytes Relative: 6 %
NEUTROS ABS: 10.3 10*3/uL — AB (ref 1.7–7.7)
NEUTROS PCT: 81 %
Platelets: 222 10*3/uL (ref 150–400)
RBC: 4.32 MIL/uL (ref 3.87–5.11)
RDW: 13 % (ref 11.5–15.5)
WBC: 12.8 10*3/uL — ABNORMAL HIGH (ref 4.0–10.5)

## 2017-11-23 LAB — INFLUENZA PANEL BY PCR (TYPE A & B)
INFLAPCR: NEGATIVE
INFLBPCR: NEGATIVE

## 2017-11-23 LAB — LACTIC ACID, PLASMA: Lactic Acid, Venous: 1.5 mmol/L (ref 0.5–1.9)

## 2017-11-23 LAB — BRAIN NATRIURETIC PEPTIDE: B Natriuretic Peptide: 208 pg/mL — ABNORMAL HIGH (ref 0.0–100.0)

## 2017-11-23 LAB — TROPONIN I: Troponin I: <0.03 ng/mL (ref <0.03)

## 2017-11-23 MED ORDER — AZITHROMYCIN 250 MG PO TABS: ORAL_TABLET | ORAL | 0 refills | Status: DC

## 2017-11-23 MED ORDER — DEXTROSE 5 % IV SOLN
1.0000 g | Freq: Once | INTRAVENOUS | Status: AC
  Administered 2017-11-23: 1 g via INTRAVENOUS
  Filled 2017-11-23: qty 10

## 2017-11-23 MED ORDER — AZITHROMYCIN 250 MG PO TABS
500.0000 mg | ORAL_TABLET | Freq: Once | ORAL | Status: AC
  Administered 2017-11-23: 500 mg via ORAL
  Filled 2017-11-23: qty 2

## 2017-11-23 MED ORDER — SODIUM CHLORIDE 0.9 % IV BOLUS (SEPSIS)
1000.0000 mL | Freq: Once | INTRAVENOUS | Status: AC
  Administered 2017-11-23: 1000 mL via INTRAVENOUS

## 2017-11-23 NOTE — ED Triage Notes (Signed)
Pt family reports pt has had cough for last several days but is unable to cough anything up. Pt family also reports decreased appetite and fatigue. nad noted.

## 2017-11-23 NOTE — ED Provider Notes (Signed)
[facility] EMERGENCY DEPARTMENT Provider Note   CSN: [id number] Arrival date & time: 11/23/17  1233     History   Chief Complaint Chief Complaint  Patient presents with  . Cough    HPI [name] [name] is a 83 y.o. female.  Pt presents to the ED today with cough for the last few days.  The pt has had decreased appetite and fatigue.  The pt is very hard of hearing and is a poor historian.      Past Medical History:  Diagnosis Date  . Cancer ([place])    skin  . Chronic back pain   . Chronic hip pain   . Chronic ulcer of left leg ([place])   . Coronary artery disease   . Dementia   . Hypertension   . Hypothyroidism   . Seizures ([place])   . Urinary incontinence due to cognitive impairment     Patient Active Problem List   Diagnosis Date Noted  . Chest pain 06/01/2017  . Hypokalemia 06/01/2017  . Hyperlipidemia 06/01/2017  . Dementia without behavioral disturbance 06/01/2017  . Dyspnea   . Elevated troponin   . UTI (urinary tract infection) 01/30/2014  . Dementia 01/29/2014  . Syncope 07/01/2013  . Chronic ulcer of left leg ([place]) 07/01/2013  . Seizures ([place]) 07/01/2013  . Unspecified hypothyroidism 07/01/2013  . Eosinophilia 07/01/2013    Past Surgical History:  Procedure Laterality Date  . ABDOMINAL HYSTERECTOMY    . lumbar back surgery      OB History    No data available       Home Medications    Prior to Admission medications   Medication Sig Start Date End Date Taking? Authorizing Provider  acetaminophen (TYLENOL) 325 MG tablet Take 650 mg by mouth every 6 (six) hours as needed for headache.   Yes [provider]  aspirin EC 81 MG tablet Take 81 mg by mouth daily.   Yes [provider]  Calcium Carbonate-Vitamin D (CALCIUM 600 + D PO) Take 1 tablet by mouth daily.    Yes [provider]  Cyanocobalamin (VITAMIN B-12) 2500 MCG SUBL Place 1 tablet under the tongue daily.   Yes [provider]  donepezil (ARICEPT) 10  MG tablet Take 10 mg by mouth at bedtime.   Yes [provider]  HYDROcodone-acetaminophen (NORCO) 10-325 MG per tablet Take 1 tablet by mouth every 6 (six) hours as needed for moderate pain. For pain 05/04/15  Yes [provider]  levocetirizine (XYZAL) 2.5 MG/5ML solution Take 5 mLs by mouth daily. 10/23/17  Yes [provider]  levothyroxine (SYNTHROID, LEVOTHROID) 75 MCG tablet Take 75 mcg by mouth daily before breakfast.   Yes [provider]  omeprazole (PRILOSEC) 20 MG capsule Take 20 mg by mouth every morning.    Yes [provider]  simvastatin (ZOCOR) 20 MG tablet Take 20 mg by mouth at bedtime.   Yes [provider]  topiramate (TOPAMAX) 100 MG tablet Take 1 tablet (100 mg total) by mouth 2 (two) times daily. 06/03/15  Yes Nat Christen, MD  vitamin C (ASCORBIC ACID) 500 MG tablet Take 500 mg by mouth daily.   Yes [provider]  azithromycin (ZITHROMAX) 250 MG tablet Take 1 every day until finished. 11/24/17   Isla Pence, MD    Family History Family History  Problem Relation Age of Onset  . Cancer Sister   . Hypertension Mother   . Heart attack Mother     Social  History Social History   Tobacco Use  . Smoking status: Never Smoker  . Smokeless tobacco: Never Used  Substance Use Topics  . Alcohol use: No  . Drug use: No     Allergies   Feldene [piroxicam]   Review of Systems Review of Systems  Respiratory: Positive for cough.   Neurological: Positive for weakness.  All other systems reviewed and are negative.    Physical Exam Updated Vital Signs BP 120/72   Pulse 78   Temp 97.8 F (36.6 C) (Oral)   Resp 17   Ht 5\' 2"  (1.575 m)   Wt 59.4 kg (131 lb)   SpO2 96%   BMI 23.96 kg/m   Physical Exam  Constitutional: She is oriented to person, place, and time. She appears well-developed and well-nourished.  HENT:  Head: Normocephalic and atraumatic.  Right Ear: External ear normal.  Left Ear:  External ear normal.  Nose: Nose normal.  Mouth/Throat: Oropharynx is clear and moist.  Eyes: Conjunctivae and EOM are normal. Pupils are equal, round, and reactive to light.  Neck: Normal range of motion. Neck supple.  Cardiovascular: Normal rate, regular rhythm, normal heart sounds and intact distal pulses.  Pulmonary/Chest: Effort normal and breath sounds normal.  Abdominal: Soft. Bowel sounds are normal.  Musculoskeletal: Normal range of motion.  Neurological: She is alert and oriented to person, place, and time.  Skin: Skin is warm and dry. Capillary refill takes less than 2 seconds.  Psychiatric: She has a normal mood and affect. Her behavior is normal. Judgment and thought content normal.  Nursing note and vitals reviewed.    ED Treatments / Results  Labs (all labs ordered are listed, but only abnormal results are displayed) Labs Reviewed  COMPREHENSIVE METABOLIC PANEL - Abnormal; Notable for the following components:      Result Value   Potassium 3.2 (*)    CO2 16 (*)    Glucose, Bld 149 (*)    BUN 25 (*)    Creatinine, Ser 1.13 (*)    ALT 11 (*)    Total Bilirubin 1.3 (*)    GFR calc non Af Amer 39 (*)    GFR calc Af Amer 46 (*)    Anion gap 16 (*)    All other components within normal limits  CBC WITH DIFFERENTIAL/PLATELET - Abnormal; Notable for the following components:   WBC 12.8 (*)    Neutro Abs 10.3 (*)    All other components within normal limits  CULTURE, BLOOD (ROUTINE X 2)  CULTURE, BLOOD (ROUTINE X 2)  PROTIME-INR  LACTIC ACID, PLASMA  TROPONIN I  URINALYSIS, ROUTINE W REFLEX MICROSCOPIC  BRAIN NATRIURETIC PEPTIDE  INFLUENZA PANEL BY PCR (TYPE A & B)    EKG  EKG Interpretation None       Radiology Dg Chest 2 View  Result Date: 11/23/2017 CLINICAL DATA:  Coughing congestion. EXAM: CHEST  2 VIEW COMPARISON:  CT scan 06/01/2017.  Chest x-ray 05/31/2017. FINDINGS: Lungs are hyperexpanded. The cardiopericardial silhouette is within normal  limits for size. Interstitial markings are diffusely coarsened with chronic features. Bones are diffusely demineralized. IMPRESSION: Stable.  No acute cardiopulmonary findings. Electronically Signed   By: Misty Stanley M.D.   On: 11/23/2017 14:08    Procedures Procedures (including critical care time)  Medications Ordered in ED Medications  cefTRIAXone (ROCEPHIN) 1 g in dextrose 5 % 50 mL IVPB (not administered)  azithromycin (ZITHROMAX) tablet 500 mg (not administered)  sodium chloride 0.9 % bolus 1,000 mL (  1,000 mLs Intravenous New Bag/Given 11/23/17 1420)     Initial Impression / Assessment and Plan / ED Course  I have reviewed the triage vital signs and the nursing notes.  Pertinent labs & imaging results that were available during my care of the patient were reviewed by me and considered in my medical decision making (see chart for details).    Pt is looking much better after 1L ns.  She is eating and drinking and interacting with her 2 daughters.  Pt will be treated for bronchitis and is instructed to return if worse.  F/u with pcp.  Final Clinical Impressions(s) / ED Diagnoses   Final diagnoses:  Acute bronchitis, unspecified organism  Dehydration    ED Discharge Orders        Ordered    azithromycin (ZITHROMAX) 250 MG tablet     11/23/17 1536       Isla Pence, MD 11/23/17 1537

## 2017-11-27 ENCOUNTER — Emergency Department (HOSPITAL_COMMUNITY): Payer: Medicare Other

## 2017-11-27 ENCOUNTER — Other Ambulatory Visit: Payer: Self-pay

## 2017-11-27 ENCOUNTER — Inpatient Hospital Stay (HOSPITAL_COMMUNITY)
Admission: EM | Admit: 2017-11-27 | Discharge: 2017-11-30 | DRG: 153 | Disposition: A | Discharge status: Home Health | Payer: Medicare Other | Attending: Family Medicine | Admitting: Family Medicine

## 2017-11-27 ENCOUNTER — Encounter (HOSPITAL_COMMUNITY): Payer: Self-pay | Admitting: Emergency Medicine

## 2017-11-27 DIAGNOSIS — Z79899 Other long term (current) drug therapy: Secondary | ICD-10-CM

## 2017-11-27 DIAGNOSIS — F0390 Unspecified dementia without behavioral disturbance: Secondary | ICD-10-CM | POA: Diagnosis not present

## 2017-11-27 DIAGNOSIS — E039 Hypothyroidism, unspecified: Secondary | ICD-10-CM | POA: Diagnosis present

## 2017-11-27 DIAGNOSIS — I10 Essential (primary) hypertension: Secondary | ICD-10-CM | POA: Diagnosis not present

## 2017-11-27 DIAGNOSIS — M25559 Pain in unspecified hip: Secondary | ICD-10-CM | POA: Diagnosis present

## 2017-11-27 DIAGNOSIS — R824 Acetonuria: Secondary | ICD-10-CM | POA: Diagnosis present

## 2017-11-27 DIAGNOSIS — R569 Unspecified convulsions: Secondary | ICD-10-CM | POA: Diagnosis not present

## 2017-11-27 DIAGNOSIS — I2510 Atherosclerotic heart disease of native coronary artery without angina pectoris: Secondary | ICD-10-CM | POA: Diagnosis present

## 2017-11-27 DIAGNOSIS — R627 Adult failure to thrive: Secondary | ICD-10-CM | POA: Diagnosis present

## 2017-11-27 DIAGNOSIS — R531 Weakness: Secondary | ICD-10-CM | POA: Diagnosis not present

## 2017-11-27 DIAGNOSIS — G40909 Epilepsy, unspecified, not intractable, without status epilepticus: Secondary | ICD-10-CM | POA: Diagnosis present

## 2017-11-27 DIAGNOSIS — G8929 Other chronic pain: Secondary | ICD-10-CM | POA: Diagnosis present

## 2017-11-27 DIAGNOSIS — Z90710 Acquired absence of both cervix and uterus: Secondary | ICD-10-CM

## 2017-11-27 DIAGNOSIS — Z79890 Hormone replacement therapy (postmenopausal): Secondary | ICD-10-CM

## 2017-11-27 DIAGNOSIS — Z8249 Family history of ischemic heart disease and other diseases of the circulatory system: Secondary | ICD-10-CM

## 2017-11-27 DIAGNOSIS — J0140 Acute pansinusitis, unspecified: Secondary | ICD-10-CM

## 2017-11-27 DIAGNOSIS — E860 Dehydration: Secondary | ICD-10-CM | POA: Diagnosis present

## 2017-11-27 DIAGNOSIS — R634 Abnormal weight loss: Secondary | ICD-10-CM | POA: Diagnosis present

## 2017-11-27 DIAGNOSIS — M549 Dorsalgia, unspecified: Secondary | ICD-10-CM | POA: Diagnosis present

## 2017-11-27 DIAGNOSIS — N39498 Other specified urinary incontinence: Secondary | ICD-10-CM | POA: Diagnosis present

## 2017-11-27 DIAGNOSIS — Z66 Do not resuscitate: Secondary | ICD-10-CM | POA: Diagnosis present

## 2017-11-27 DIAGNOSIS — Z7982 Long term (current) use of aspirin: Secondary | ICD-10-CM

## 2017-11-27 DIAGNOSIS — Z85828 Personal history of other malignant neoplasm of skin: Secondary | ICD-10-CM

## 2017-11-27 DIAGNOSIS — J40 Bronchitis, not specified as acute or chronic: Secondary | ICD-10-CM | POA: Diagnosis present

## 2017-11-27 DIAGNOSIS — J324 Chronic pansinusitis: Principal | ICD-10-CM | POA: Diagnosis present

## 2017-11-27 DIAGNOSIS — H9190 Unspecified hearing loss, unspecified ear: Secondary | ICD-10-CM | POA: Diagnosis present

## 2017-11-27 DIAGNOSIS — Z888 Allergy status to other drugs, medicaments and biological substances status: Secondary | ICD-10-CM

## 2017-11-27 LAB — CBC
HCT: 40.4 % (ref 36.0–46.0)
Hemoglobin: 13.3 g/dL (ref 12.0–15.0)
MCH: 32 pg (ref 26.0–34.0)
MCHC: 32.9 g/dL (ref 30.0–36.0)
MCV: 97.1 fL (ref 78.0–100.0)
PLATELETS: 306 10*3/uL (ref 150–400)
RBC: 4.16 MIL/uL (ref 3.87–5.11)
RDW: 12.9 % (ref 11.5–15.5)
WBC: 10.1 10*3/uL (ref 4.0–10.5)

## 2017-11-27 LAB — TROPONIN I

## 2017-11-27 LAB — BASIC METABOLIC PANEL
Anion gap: 12 (ref 5–15)
BUN: 18 mg/dL (ref 6–20)
CALCIUM: 10 mg/dL (ref 8.9–10.3)
CHLORIDE: 104 mmol/L (ref 101–111)
CO2: 20 mmol/L — AB (ref 22–32)
CREATININE: 0.91 mg/dL (ref 0.44–1.00)
GFR calc non Af Amer: 51 mL/min — ABNORMAL LOW (ref >60)
GFR, EST AFRICAN AMERICAN: 59 mL/min — AB (ref >60)
GLUCOSE: 95 mg/dL (ref 65–99)
Potassium: 3.4 mmol/L — ABNORMAL LOW (ref 3.5–5.1)
Sodium: 136 mmol/L (ref 135–145)

## 2017-11-27 LAB — URINALYSIS, ROUTINE W REFLEX MICROSCOPIC
Bilirubin Urine: NEGATIVE
GLUCOSE, UA: NEGATIVE mg/dL
HGB URINE DIPSTICK: NEGATIVE
Ketones, ur: 80 mg/dL — AB
Leukocytes, UA: NEGATIVE
Nitrite: NEGATIVE
PROTEIN: NEGATIVE mg/dL
Specific Gravity, Urine: 1.02 (ref 1.005–1.030)
pH: 6 (ref 5.0–8.0)

## 2017-11-27 LAB — BRAIN NATRIURETIC PEPTIDE: B Natriuretic Peptide: 228 pg/mL — ABNORMAL HIGH (ref 0.0–100.0)

## 2017-11-27 MED ORDER — ENOXAPARIN SODIUM 30 MG/0.3ML ~~LOC~~ SOLN
30.0000 mg | SUBCUTANEOUS | Status: DC
  Administered 2017-11-27 – 2017-11-29 (×3): 30 mg via SUBCUTANEOUS
  Filled 2017-11-27 (×3): qty 0.3

## 2017-11-27 MED ORDER — SODIUM CHLORIDE 0.9 % IV BOLUS (SEPSIS)
500.0000 mL | Freq: Once | INTRAVENOUS | Status: AC
  Administered 2017-11-27: 500 mL via INTRAVENOUS

## 2017-11-27 MED ORDER — LORATADINE 10 MG PO TABS
10.0000 mg | ORAL_TABLET | Freq: Every day | ORAL | Status: DC
  Administered 2017-11-28 – 2017-11-30 (×3): 10 mg via ORAL
  Filled 2017-11-27 (×3): qty 1

## 2017-11-27 MED ORDER — ONDANSETRON HCL 4 MG PO TABS: 4.0000 mg | ORAL_TABLET | Freq: Four times a day (QID) | ORAL | Status: DC | PRN

## 2017-11-27 MED ORDER — ENSURE ENLIVE PO LIQD
237.0000 mL | Freq: Three times a day (TID) | ORAL | Status: DC
  Administered 2017-11-28 (×2): 237 mL via ORAL

## 2017-11-27 MED ORDER — LEVOCETIRIZINE DIHYDROCHLORIDE 2.5 MG/5ML PO SOLN: 2.5000 mg | Freq: Every day | ORAL | Status: DC

## 2017-11-27 MED ORDER — SODIUM CHLORIDE 0.9 % IV SOLN: Freq: Once | INTRAVENOUS | Status: DC

## 2017-11-27 MED ORDER — HYDROCODONE-ACETAMINOPHEN 10-325 MG PO TABS
1.0000 | ORAL_TABLET | Freq: Four times a day (QID) | ORAL | Status: DC | PRN
  Administered 2017-11-29: 1 via ORAL
  Filled 2017-11-27: qty 1

## 2017-11-27 MED ORDER — DEXTROSE-NACL 5-0.9 % IV SOLN
INTRAVENOUS | Status: DC
  Administered 2017-11-27: 1000 mL via INTRAVENOUS
  Administered 2017-11-28: 09:00:00 via INTRAVENOUS

## 2017-11-27 MED ORDER — LEVOTHYROXINE SODIUM 75 MCG PO TABS
75.0000 ug | ORAL_TABLET | Freq: Every day | ORAL | Status: DC
  Administered 2017-11-27 – 2017-11-30 (×3): 75 ug via ORAL
  Filled 2017-11-27 (×3): qty 1

## 2017-11-27 MED ORDER — ONDANSETRON HCL 4 MG/2ML IJ SOLN: 4.0000 mg | Freq: Four times a day (QID) | INTRAMUSCULAR | Status: DC | PRN

## 2017-11-27 MED ORDER — ACETAMINOPHEN 650 MG RE SUPP: 650.0000 mg | Freq: Four times a day (QID) | RECTAL | Status: DC | PRN

## 2017-11-27 MED ORDER — DONEPEZIL HCL 5 MG PO TABS
10.0000 mg | ORAL_TABLET | Freq: Every day | ORAL | Status: DC
  Administered 2017-11-27 – 2017-11-29 (×3): 10 mg via ORAL
  Filled 2017-11-27 (×3): qty 2

## 2017-11-27 MED ORDER — LEVOFLOXACIN IN D5W 500 MG/100ML IV SOLN
500.0000 mg | INTRAVENOUS | Status: DC
  Administered 2017-11-27 – 2017-11-29 (×2): 500 mg via INTRAVENOUS
  Filled 2017-11-27 (×3): qty 100

## 2017-11-27 MED ORDER — ACETAMINOPHEN 325 MG PO TABS: 650.0000 mg | ORAL_TABLET | Freq: Four times a day (QID) | ORAL | Status: DC | PRN

## 2017-11-27 MED ORDER — SIMVASTATIN 20 MG PO TABS
20.0000 mg | ORAL_TABLET | Freq: Every day | ORAL | Status: DC
  Administered 2017-11-27 – 2017-11-29 (×3): 20 mg via ORAL
  Filled 2017-11-27 (×3): qty 1

## 2017-11-27 MED ORDER — ASPIRIN EC 81 MG PO TBEC
81.0000 mg | DELAYED_RELEASE_TABLET | Freq: Every day | ORAL | Status: DC
  Administered 2017-11-27 – 2017-11-30 (×4): 81 mg via ORAL
  Filled 2017-11-27 (×4): qty 1

## 2017-11-27 MED ORDER — TOPIRAMATE 100 MG PO TABS
100.0000 mg | ORAL_TABLET | Freq: Two times a day (BID) | ORAL | Status: DC
  Administered 2017-11-27 – 2017-11-30 (×6): 100 mg via ORAL
  Filled 2017-11-27 (×6): qty 1

## 2017-11-27 MED ORDER — PANTOPRAZOLE SODIUM 40 MG PO TBEC
40.0000 mg | DELAYED_RELEASE_TABLET | Freq: Every day | ORAL | Status: DC
  Administered 2017-11-28 – 2017-11-30 (×3): 40 mg via ORAL
  Filled 2017-11-27 (×3): qty 1

## 2017-11-27 NOTE — H&P (Signed)
History and Physical  Jaliyah Fotheringham WPY:[id number] DOB: [birth year] DOA: 11/27/2017  Referring physician: Dr Lita Mains, ED physician PCP: [facility]  Outpatient Specialists: none  Patient Coming From: home  Chief Complaint: weakness, failure to thrive  HPI: Jolita Haefner is a 84 y.o. 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  History obtained by son due to patient's dementia.  Patient has had diminished appetite with decreased oral intake of food and liquid over the past 2-1/2 weeks.  Patient had increased weakness at that time.  She was brought to the emergency department on Monday and was diagnosed with bronchitis and sent home on a Z-Pak.  In the emergency department she did receive a liter of IV fluids and appeared to improve while in the emergency department, however the patient's appetite did not improve.  She has continued to have very little oral intake.  No palliating or provoking factors.  No fevers, chills, nausea, vomiting, diarrhea.  Emergency Department Course: CT scan of the head shows no acute cranial process.  She does have pansinusitis.  Chest x-ray normal.  White count normal.  UA with ketones but otherwise normal  Review of Systems:  Unable to obtain secondary to dementia  Past Medical History:  Diagnosis Date  . Cancer ([place])    skin  . Chronic back pain   . Chronic hip pain   . Chronic ulcer of left leg ([place])   . Coronary artery disease   . Dementia   . Hypertension   . Hypothyroidism   . Seizures ([place])   . Urinary incontinence due to cognitive impairment    Past Surgical History:  Procedure Laterality Date  . ABDOMINAL HYSTERECTOMY    . lumbar back surgery     Social History:  reports that  has never smoked. she has never used smokeless tobacco. She reports that she does not drink alcohol or use drugs. Patient lives at home  Allergies  Allergen  Reactions  . Feldene [Piroxicam] Rash    Pt denies allergy to this medication    Family History  Problem Relation Age of Onset  . Cancer Sister   . Hypertension Mother   . Heart attack Mother       Prior to Admission medications   Medication Sig Start Date End Date Taking? Authorizing Provider  aspirin EC 81 MG tablet Take 81 mg by mouth daily.   Yes [provider]  Calcium Carbonate-Vitamin D (CALCIUM 600 + D PO) Take 1 tablet by mouth daily.    Yes [provider]  Cyanocobalamin (VITAMIN B-12) 2500 MCG SUBL Place 1 tablet under the tongue daily.   Yes [provider]  donepezil (ARICEPT) 10 MG tablet Take 10 mg by mouth at bedtime.   Yes [provider]  HYDROcodone-acetaminophen (NORCO) 10-325 MG per tablet Take 1 tablet by mouth every 6 (six) hours as needed for moderate pain. For pain at bedtime. 05/04/15  Yes [provider]  levocetirizine (XYZAL) 2.5 MG/5ML solution Take 5 mLs by mouth daily. 10/23/17  Yes [provider]  levothyroxine (SYNTHROID, LEVOTHROID) 75 MCG tablet Take 75 mcg by mouth daily before breakfast.   Yes [provider]  omeprazole (PRILOSEC) 20 MG capsule Take 20 mg by mouth every morning.    Yes [provider]  simvastatin (ZOCOR) 20 MG tablet Take 20 mg by mouth at bedtime.   Yes [provider]  topiramate (  TOPAMAX) 100 MG tablet Take 1 tablet (100 mg total) by mouth 2 (two) times daily. 06/03/15  Yes Nat Christen, MD  vitamin C (ASCORBIC ACID) 500 MG tablet Take 500 mg by mouth daily.   Yes [provider]  acetaminophen (TYLENOL) 325 MG tablet Take 650 mg by mouth every 6 (six) hours as needed for headache.    [provider]  azithromycin (ZITHROMAX) 250 MG tablet Take 1 every day until finished. Patient not taking: Reported on 11/27/2017 11/24/17   Isla Pence, MD    Physical Exam: BP (!) 162/65   Pulse 69   Temp 97.8 F (36.6 C) (Oral)   Resp 13    Ht 5\' 2"  (1.575 m)   Wt 59.4 kg (131 lb)   SpO2 100%   BMI 23.96 kg/m   General: Elderly Caucasian female. Awake and alert and oriented x3. No acute cardiopulmonary distress.  HEENT: Normocephalic atraumatic.  Right and left ears normal in appearance.  Pupils equal, round, reactive to light. Extraocular muscles are intact. Sclerae anicteric and noninjected.  Dry mucosal membranes. No mucosal lesions.  Neck: Neck supple without lymphadenopathy. No carotid bruits. No masses palpated.  Cardiovascular: Regular rate with normal S1-S2 sounds. No murmurs, rubs, gallops auscultated. No JVD.  Respiratory: Good respiratory effort with no wheezes, rales, rhonchi. Lungs clear to auscultation bilaterally.  No accessory muscle use. Abdomen: Soft, nontender, nondistended. Active bowel sounds. No masses or hepatosplenomegaly  Skin: No rashes, lesions, or ulcerations.  Dry, warm to touch. 2+ dorsalis pedis and radial pulses. Musculoskeletal: No calf or leg masses. All major joints not erythematous nontender.  No upper or lower joint deformation.  No contractures  Psychiatric: Unable to assess. Neurologic: Unable to assess.           Labs on Admission: I have personally reviewed following labs and imaging studies  CBC: Recent Labs  Lab 11/23/17 1345 11/27/17 1317  WBC 12.8* 10.1  NEUTROABS 10.3*  --   HGB 13.8 13.3  HCT 42.2 40.4  MCV 97.7 97.1  PLT 222 161   Basic Metabolic Panel: Recent Labs  Lab 11/23/17 1345 11/27/17 1317  NA 140 136  K 3.2* 3.4*  CL 108 104  CO2 16* 20*  GLUCOSE 149* 95  BUN 25* 18  CREATININE 1.13* 0.91  CALCIUM 9.8 10.0   GFR: Estimated Creatinine Clearance: 27.9 mL/min (by C-G formula based on SCr of 0.91 mg/dL). Liver Function Tests: Recent Labs  Lab 11/23/17 1345  AST 21  ALT 11*  ALKPHOS 77  BILITOT 1.3*  PROT 7.2  ALBUMIN 3.5   No results for input(s): LIPASE, AMYLASE in the last 168 hours. No results for input(s): AMMONIA in the last 168  hours. Coagulation Profile: Recent Labs  Lab 11/23/17 1345  INR 1.19   Cardiac Enzymes: Recent Labs  Lab 11/23/17 1359 11/27/17 1834  TROPONINI <0.03 <0.03   BNP (last 3 results) No results for input(s): PROBNP in the last 8760 hours. HbA1C: No results for input(s): HGBA1C in the last 72 hours. CBG: No results for input(s): GLUCAP in the last 168 hours. Lipid Profile: No results for input(s): CHOL, HDL, LDLCALC, TRIG, CHOLHDL, LDLDIRECT in the last 72 hours. Thyroid Function Tests: No results for input(s): TSH, T4TOTAL, FREET4, T3FREE, THYROIDAB in the last 72 hours. Anemia Panel: No results for input(s): VITAMINB12, FOLATE, FERRITIN, TIBC, IRON, RETICCTPCT in the last 72 hours. Urine analysis:    Component Value Date/Time   COLORURINE YELLOW 11/27/2017 1930   APPEARANCEUR  CLEAR 11/27/2017 1930   LABSPEC 1.020 11/27/2017 1930   PHURINE 6.0 11/27/2017 1930   GLUCOSEU NEGATIVE 11/27/2017 1930   HGBUR NEGATIVE 11/27/2017 1930   [place] NEGATIVE 11/27/2017 1930   KETONESUR 80 (A) 11/27/2017 1930   PROTEINUR NEGATIVE 11/27/2017 1930   UROBILINOGEN 0.2 06/03/2015 1415   NITRITE NEGATIVE 11/27/2017 1930   LEUKOCYTESUR NEGATIVE 11/27/2017 1930   Sepsis Labs: @LABRCNTIP (procalcitonin:4,lacticidven:4) ) Recent Results (from the past 240 hour(s))  Culture, blood (Routine x 2)     Status: None (Preliminary result)   Collection Time: 11/23/17  1:45 PM  Result Value Ref Range Status   Specimen Description BLOOD RIGHT FOREARM  Final   Special Requests   Final    BOTTLES DRAWN AEROBIC AND ANAEROBIC Blood Culture adequate volume   Culture NO GROWTH 4 DAYS  Final   Report Status PENDING  Incomplete  Culture, blood (Routine x 2)     Status: None (Preliminary result)   Collection Time: 11/23/17  1:51 PM  Result Value Ref Range Status   Specimen Description RIGHT ANTECUBITAL  Final   Special Requests   Final    BOTTLES DRAWN AEROBIC AND ANAEROBIC Blood Culture adequate  volume   Culture NO GROWTH 4 DAYS  Final   Report Status PENDING  Incomplete     Radiological Exams on Admission: Dg Chest 2 View  Result Date: 11/27/2017 CLINICAL DATA:  Continue weakness, cough and confusion. EXAM: CHEST  2 VIEW COMPARISON:  11/23/2017. FINDINGS: The patient is tilted and rotated on this exam. Stable cardiomegaly with uncoiled appearance of the thoracic aorta. Minimal atelectasis at the right lung base. Slight blunting of the right posterior and lateral costophrenic angles may reflect a tiny effusion for given slightly hyperinflated appearance of the lungs, pleural blunting. Degenerative change of the dorsal spine and included shoulders. IMPRESSION: Stable cardiomegaly with aortic atherosclerosis. No active pulmonary disease. Electronically Signed   By: Ashley Royalty M.D.   On: 11/27/2017 19:15   Ct Head Wo Contrast  Result Date: 11/27/2017 CLINICAL DATA:  Confusion, weakness, and cough. EXAM: CT HEAD WITHOUT CONTRAST TECHNIQUE: Contiguous axial images were obtained from the base of the skull through the vertex without intravenous contrast. COMPARISON:  12/17/2016 FINDINGS: Brain: There is no evidence of acute infarct, intracranial hemorrhage, mass, midline shift, or extra-axial fluid collection. Mild age related cerebral atrophy is unchanged. Periventricular white matter hypodensities are similar to the prior study and nonspecific but compatible with chronic small vessel ischemic disease, mild for age. Vascular: Calcified atherosclerosis at the skullbase. No hyperdense vessel. Skull: No fracture or focal osseous lesion. Sinuses/Orbits: Near complete opacification of all paranasal sinuses bilaterally with scattered fluid levels. Clear mastoid air cells. Prior cataract extraction. Other: None. IMPRESSION: 1. No evidence of acute intracranial abnormality. 2. Extensive pansinusitis. Electronically Signed   By: Logan Bores M.D.   On: 11/27/2017 21:05    EKG: Independently reviewed.  Sinus  rhythm with borderline prolonged PR intervals.  Borderline QT.  No acute ST changes.  Assessment/Plan: Principal Problem:   Failure to thrive in adult Active Problems:   Seizures ([facility])   Hypothyroidism   Dementia without behavioral disturbance   Pansinusitis   Ketonuria   Essential hypertension   Generalized weakness    This patient was discussed with the ED physician, including pertinent vitals, physical exam findings, labs, and imaging.  We also discussed care given by the ED provider.  1.  Failure to thrive in adult  Observation  Question etiology -progression of dementia  versus infection from sinusitis/bronchitis.  D5 normal saline overnight  If patient does not start taking oral food or liquid, may need pharmacy consult for TPN for short period of time  We will check a TSH  May need Remeron or Megace for appetite stimulation 2.  Pansinusitis  Start Levaquin -pharmacy to dose due to age 26.  Generalized weakness  Secondary to dehydration and failure to thrive  We will treat as above 4.  Ketonuria  D5 normal saline overnight 5.  Dementia  Continue Namenda 6.  Hypothyroidism  Continue Synthroid  Check TSH 7.  Hypertension  Continue antihypertensives 8.  Seizures  Continue Topamax  DVT prophylaxis: Lovenox Consultants: Pharmacy Code Status: DNR Family Communication: History obtained by son and daughter-in-law Disposition Plan: Pending   Jacob Moores [facility] Pager [phone]  If 7PM-7AM, please contact night-coverage [email or web address] Password [email or web address]

## 2017-11-27 NOTE — ED Triage Notes (Addendum)
Pt family brought pt back to ED for continued weakness, cough, confusion. Pt seen for same on Wednesday.

## 2017-11-27 NOTE — Progress Notes (Signed)
Pharmacy Antibiotic Note  Shaindel Sweeten is a 84 y.o. female admitted on 11/27/2017 with bronchitis.  Pharmacy has been consulted for levaquin dosing.  Plan: Levaquin 500 mg IV q48 hours F/u renal function, cultures and clinical course  Height: 5\' 2"  (157.5 cm) Weight: 131 lb (59.4 kg) IBW/kg (Calculated) : 50.1  Temp (24hrs), Avg:97.8 F (36.6 C), Min:97.8 F (36.6 C), Max:97.8 F (36.6 C)  Recent Labs  Lab 11/23/17 1345 11/27/17 1317  WBC 12.8* 10.1  CREATININE 1.13* 0.91  LATICACIDVEN 1.5  --     Estimated Creatinine Clearance: 27.9 mL/min (by C-G formula based on SCr of 0.91 mg/dL).    Allergies  Allergen Reactions  . Feldene [Piroxicam] Rash    Pt denies allergy to this medication    Antimicrobials this admission: levaquin 1/4 >>    Thank you for allowing pharmacy to be a part of this patient's care.  Beverlee Nims 11/27/2017 9:16 PM

## 2017-11-27 NOTE — ED Notes (Signed)
Weakness and decreased appetite per family

## 2017-11-27 NOTE — ED Provider Notes (Signed)
[name]   CSN: [id number] Arrival date & time: 11/27/17  1210     History   Chief Complaint Chief Complaint  Patient presents with  . Weakness    HPI [name] [name] is a 84 y.o. female.  HPI Patient lives at home with her son and daughter-in-law.  She has had increased weakness, drowsiness and decreased oral intake for the past week.  Initially had a cough and was seen in the emergency department on 12/31.  Given antibiotic for bronchitis.  Son states the cough has improved well.  Patient is very hard of hearing and has baseline dementia.  Level 5 caveat applies. Past Medical History:  Diagnosis Date  . Cancer ([place])    skin  . Chronic back pain   . Chronic hip pain   . Chronic ulcer of left leg ([place])   . Coronary artery disease   . Dementia   . Hypertension   . Hypothyroidism   . Seizures ([place])   . Urinary incontinence due to cognitive impairment     Patient Active Problem List   Diagnosis Date Noted  . Chest pain 06/01/2017  . Hypokalemia 06/01/2017  . Hyperlipidemia 06/01/2017  . Dementia without behavioral disturbance 06/01/2017  . Dyspnea   . Elevated troponin   . UTI (urinary tract infection) 01/30/2014  . Dementia 01/29/2014  . Syncope 07/01/2013  . Chronic ulcer of left leg ([place]) 07/01/2013  . Seizures ([place]) 07/01/2013  . Unspecified hypothyroidism 07/01/2013  . Eosinophilia 07/01/2013    Past Surgical History:  Procedure Laterality Date  . ABDOMINAL HYSTERECTOMY    . lumbar back surgery      OB History    No data available       Home Medications    Prior to Admission medications   Medication Sig Start Date End Date Taking? Authorizing Provider  aspirin EC 81 MG tablet Take 81 mg by mouth daily.   Yes [provider]  Calcium Carbonate-Vitamin D (CALCIUM 600 + D PO) Take 1 tablet by mouth daily.    Yes [provider]  Cyanocobalamin (VITAMIN B-12) 2500 MCG SUBL Place 1 tablet under the  tongue daily.   Yes [provider]  donepezil (ARICEPT) 10 MG tablet Take 10 mg by mouth at bedtime.   Yes [provider]  HYDROcodone-acetaminophen (NORCO) 10-325 MG per tablet Take 1 tablet by mouth every 6 (six) hours as needed for moderate pain. For pain at bedtime. 05/04/15  Yes [provider]  levocetirizine (XYZAL) 2.5 MG/5ML solution Take 5 mLs by mouth daily. 10/23/17  Yes [provider]  levothyroxine (SYNTHROID, LEVOTHROID) 75 MCG tablet Take 75 mcg by mouth daily before breakfast.   Yes [provider]  omeprazole (PRILOSEC) 20 MG capsule Take 20 mg by mouth every morning.    Yes [provider]  simvastatin (ZOCOR) 20 MG tablet Take 20 mg by mouth at bedtime.   Yes [provider]  topiramate (TOPAMAX) 100 MG tablet Take 1 tablet (100 mg total) by mouth 2 (two) times daily. 06/03/15  Yes Nat Christen, MD  vitamin C (ASCORBIC ACID) 500 MG tablet Take 500 mg by mouth daily.   Yes [provider]  acetaminophen (TYLENOL) 325 MG tablet Take 650 mg by mouth every 6 (six) hours as needed for headache.    [provider]  azithromycin (ZITHROMAX) 250 MG tablet Take 1 every day until finished. Patient not taking: Reported on 11/27/2017 11/24/17  Isla Pence, MD    Family History Family History  Problem Relation Age of Onset  . Cancer Sister   . Hypertension Mother   . Heart attack Mother     Social History Social History   Tobacco Use  . Smoking status: Never Smoker  . Smokeless tobacco: Never Used  Substance Use Topics  . Alcohol use: No  . Drug use: No     Allergies   Feldene [piroxicam]   Review of Systems Review of Systems  Unable to perform ROS: Dementia     Physical Exam Updated Vital Signs BP (!) 162/65   Pulse 69   Temp 97.8 F (36.6 C) (Oral)   Resp 13   Ht 5\' 2"  (1.575 m)   Wt 59.4 kg (131 lb)   SpO2 100%   BMI 23.96 kg/m   Physical Exam  Constitutional: She  appears well-developed and well-nourished. No distress.  HENT:  Head: Normocephalic and atraumatic.  Dry mucous membranes.  Eyes: EOM are normal. Pupils are equal, round, and reactive to light.  Neck: Normal range of motion. Neck supple.  No meningismus  Cardiovascular: Normal rate and regular rhythm. Exam reveals no gallop and no friction rub.  No murmur heard. Pulmonary/Chest: Effort normal. No stridor. No respiratory distress. She has no wheezes. She has no rales. She exhibits no tenderness.  Diminished breath sounds in the right lung field.  Abdominal: Soft. Bowel sounds are normal. There is no tenderness. There is no rebound and no guarding.  Musculoskeletal: Normal range of motion. She exhibits no edema or tenderness.  No lower extremity swelling, asymmetry or tenderness.  Neurological: She is alert.  5/5 motor in all extremities.  Sensation grossly intact.  Patient very hard of hearing.  Follows simple commands.  Skin: Skin is warm and dry. Capillary refill takes less than 2 seconds. No rash noted. She is not diaphoretic. No erythema.  Nursing note and vitals reviewed.    ED Treatments / Results  Labs (all labs ordered are listed, but only abnormal results are displayed) Labs Reviewed  BASIC METABOLIC PANEL - Abnormal; Notable for the following components:      Result Value   Potassium 3.4 (*)    CO2 20 (*)    GFR calc non Af Amer 51 (*)    GFR calc Af Amer 59 (*)    All other components within normal limits  URINALYSIS, ROUTINE W REFLEX MICROSCOPIC - Abnormal; Notable for the following components:   Ketones, ur 80 (*)    All other components within normal limits  BRAIN NATRIURETIC PEPTIDE - Abnormal; Notable for the following components:   B Natriuretic Peptide 228.0 (*)    All other components within normal limits  CBC  TROPONIN I    EKG  EKG Interpretation None       Radiology Dg Chest 2 View  Result Date: 11/27/2017 CLINICAL DATA:  Continue weakness,  cough and confusion. EXAM: CHEST  2 VIEW COMPARISON:  11/23/2017. FINDINGS: The patient is tilted and rotated on this exam. Stable cardiomegaly with uncoiled appearance of the thoracic aorta. Minimal atelectasis at the right lung base. Slight blunting of the right posterior and lateral costophrenic angles may reflect a tiny effusion for given slightly hyperinflated appearance of the lungs, pleural blunting. Degenerative change of the dorsal spine and included shoulders. IMPRESSION: Stable cardiomegaly with aortic atherosclerosis. No active pulmonary disease. Electronically Signed   By: Ashley Royalty M.D.   On: 11/27/2017 19:15   Ct Head Wo  Contrast  Result Date: 11/27/2017 CLINICAL DATA:  Confusion, weakness, and cough. EXAM: CT HEAD WITHOUT CONTRAST TECHNIQUE: Contiguous axial images were obtained from the base of the skull through the vertex without intravenous contrast. COMPARISON:  12/17/2016 FINDINGS: Brain: There is no evidence of acute infarct, intracranial hemorrhage, mass, midline shift, or extra-axial fluid collection. Mild age related cerebral atrophy is unchanged. Periventricular white matter hypodensities are similar to the prior study and nonspecific but compatible with chronic small vessel ischemic disease, mild for age. Vascular: Calcified atherosclerosis at the skullbase. No hyperdense vessel. Skull: No fracture or focal osseous lesion. Sinuses/Orbits: Near complete opacification of all paranasal sinuses bilaterally with scattered fluid levels. Clear mastoid air cells. Prior cataract extraction. Other: None. IMPRESSION: 1. No evidence of acute intracranial abnormality. 2. Extensive pansinusitis. Electronically Signed   By: Logan Bores M.D.   On: 11/27/2017 21:05    Procedures Procedures (including critical care time)  Medications Ordered in ED Medications  0.9 %  sodium chloride infusion (not administered)  sodium chloride 0.9 % bolus 500 mL (0 mLs Intravenous Stopped 11/27/17 2048)      Initial Impression / Assessment and Plan / ED Course  I have reviewed the triage vital signs and the nursing notes.  Pertinent labs & imaging results that were available during my care of the patient were reviewed by me and considered in my medical decision making (see chart for details).      Given IV fluids in the emergency department.  CT head with evidence of pansinusitis.  Discussed with hospitalist who will admit Final Clinical Impressions(s) / ED Diagnoses   Final diagnoses:  Generalized weakness  Dehydration  FTT (failure to thrive) in adult  Pansinusitis, unspecified chronicity    ED Discharge Orders    None       Julianne Rice, MD 11/27/17 2111

## 2017-11-28 ENCOUNTER — Encounter (HOSPITAL_COMMUNITY): Payer: Self-pay

## 2017-11-28 ENCOUNTER — Other Ambulatory Visit: Payer: Self-pay

## 2017-11-28 DIAGNOSIS — J324 Chronic pansinusitis: Secondary | ICD-10-CM | POA: Diagnosis present

## 2017-11-28 DIAGNOSIS — Z79899 Other long term (current) drug therapy: Secondary | ICD-10-CM | POA: Diagnosis not present

## 2017-11-28 DIAGNOSIS — E039 Hypothyroidism, unspecified: Secondary | ICD-10-CM | POA: Diagnosis present

## 2017-11-28 DIAGNOSIS — J40 Bronchitis, not specified as acute or chronic: Secondary | ICD-10-CM | POA: Diagnosis present

## 2017-11-28 DIAGNOSIS — M549 Dorsalgia, unspecified: Secondary | ICD-10-CM | POA: Diagnosis present

## 2017-11-28 DIAGNOSIS — Z90710 Acquired absence of both cervix and uterus: Secondary | ICD-10-CM | POA: Diagnosis not present

## 2017-11-28 DIAGNOSIS — E860 Dehydration: Secondary | ICD-10-CM | POA: Diagnosis present

## 2017-11-28 DIAGNOSIS — R569 Unspecified convulsions: Secondary | ICD-10-CM | POA: Diagnosis not present

## 2017-11-28 DIAGNOSIS — J0140 Acute pansinusitis, unspecified: Secondary | ICD-10-CM | POA: Diagnosis not present

## 2017-11-28 DIAGNOSIS — R531 Weakness: Secondary | ICD-10-CM | POA: Diagnosis present

## 2017-11-28 DIAGNOSIS — Z66 Do not resuscitate: Secondary | ICD-10-CM | POA: Diagnosis present

## 2017-11-28 DIAGNOSIS — R824 Acetonuria: Secondary | ICD-10-CM | POA: Diagnosis present

## 2017-11-28 DIAGNOSIS — F0390 Unspecified dementia without behavioral disturbance: Secondary | ICD-10-CM | POA: Diagnosis not present

## 2017-11-28 DIAGNOSIS — M25559 Pain in unspecified hip: Secondary | ICD-10-CM | POA: Diagnosis present

## 2017-11-28 DIAGNOSIS — R627 Adult failure to thrive: Secondary | ICD-10-CM | POA: Diagnosis present

## 2017-11-28 DIAGNOSIS — Z85828 Personal history of other malignant neoplasm of skin: Secondary | ICD-10-CM | POA: Diagnosis not present

## 2017-11-28 DIAGNOSIS — H9190 Unspecified hearing loss, unspecified ear: Secondary | ICD-10-CM | POA: Diagnosis present

## 2017-11-28 DIAGNOSIS — G40909 Epilepsy, unspecified, not intractable, without status epilepticus: Secondary | ICD-10-CM | POA: Diagnosis present

## 2017-11-28 DIAGNOSIS — G8929 Other chronic pain: Secondary | ICD-10-CM | POA: Diagnosis present

## 2017-11-28 DIAGNOSIS — I10 Essential (primary) hypertension: Secondary | ICD-10-CM | POA: Diagnosis present

## 2017-11-28 DIAGNOSIS — Z7982 Long term (current) use of aspirin: Secondary | ICD-10-CM | POA: Diagnosis not present

## 2017-11-28 DIAGNOSIS — I2510 Atherosclerotic heart disease of native coronary artery without angina pectoris: Secondary | ICD-10-CM | POA: Diagnosis present

## 2017-11-28 DIAGNOSIS — N39498 Other specified urinary incontinence: Secondary | ICD-10-CM | POA: Diagnosis present

## 2017-11-28 DIAGNOSIS — R634 Abnormal weight loss: Secondary | ICD-10-CM | POA: Diagnosis present

## 2017-11-28 DIAGNOSIS — Z79890 Hormone replacement therapy (postmenopausal): Secondary | ICD-10-CM | POA: Diagnosis not present

## 2017-11-28 DIAGNOSIS — Z8249 Family history of ischemic heart disease and other diseases of the circulatory system: Secondary | ICD-10-CM | POA: Diagnosis not present

## 2017-11-28 DIAGNOSIS — Z888 Allergy status to other drugs, medicaments and biological substances status: Secondary | ICD-10-CM | POA: Diagnosis not present

## 2017-11-28 LAB — COMPREHENSIVE METABOLIC PANEL
ALBUMIN: 2.6 g/dL — AB (ref 3.5–5.0)
ALT: 19 U/L (ref 14–54)
ANION GAP: 9 (ref 5–15)
AST: 44 U/L — ABNORMAL HIGH (ref 15–41)
Alkaline Phosphatase: 58 U/L (ref 38–126)
BUN: 13 mg/dL (ref 6–20)
CO2: 21 mmol/L — AB (ref 22–32)
Calcium: 9.1 mg/dL (ref 8.9–10.3)
Chloride: 108 mmol/L (ref 101–111)
Creatinine, Ser: 0.66 mg/dL (ref 0.44–1.00)
GFR calc Af Amer: >60 mL/min (ref >60)
GFR calc non Af Amer: >60 mL/min (ref >60)
Glucose, Bld: 122 mg/dL — ABNORMAL HIGH (ref 65–99)
POTASSIUM: 2.9 mmol/L — AB (ref 3.5–5.1)
SODIUM: 138 mmol/L (ref 135–145)
Total Bilirubin: 0.6 mg/dL (ref 0.3–1.2)
Total Protein: 5.8 g/dL — ABNORMAL LOW (ref 6.5–8.1)

## 2017-11-28 LAB — CBC
HEMATOCRIT: 35.7 % — AB (ref 36.0–46.0)
HEMOGLOBIN: 12 g/dL (ref 12.0–15.0)
MCH: 32.3 pg (ref 26.0–34.0)
MCHC: 33.6 g/dL (ref 30.0–36.0)
MCV: 96 fL (ref 78.0–100.0)
Platelets: 260 10*3/uL (ref 150–400)
RBC: 3.72 MIL/uL — ABNORMAL LOW (ref 3.87–5.11)
RDW: 12.9 % (ref 11.5–15.5)
WBC: 7.4 10*3/uL (ref 4.0–10.5)

## 2017-11-28 LAB — CULTURE, BLOOD (ROUTINE X 2)
CULTURE: NO GROWTH
Culture: NO GROWTH
SPECIAL REQUESTS: ADEQUATE
Special Requests: ADEQUATE

## 2017-11-28 LAB — GLUCOSE, CAPILLARY: GLUCOSE-CAPILLARY: 125 mg/dL — AB (ref 65–99)

## 2017-11-28 LAB — TSH: TSH: 1.233 u[IU]/mL (ref 0.350–4.500)

## 2017-11-28 LAB — MAGNESIUM: MAGNESIUM: 1.8 mg/dL (ref 1.7–2.4)

## 2017-11-28 MED ORDER — BISACODYL 10 MG RE SUPP: 10.0000 mg | Freq: Every day | RECTAL | Status: DC | PRN

## 2017-11-28 MED ORDER — DOCUSATE SODIUM 100 MG PO CAPS
100.0000 mg | ORAL_CAPSULE | Freq: Two times a day (BID) | ORAL | Status: DC
  Administered 2017-11-28 – 2017-11-30 (×4): 100 mg via ORAL
  Filled 2017-11-28 (×4): qty 1

## 2017-11-28 MED ORDER — POTASSIUM CHLORIDE CRYS ER 20 MEQ PO TBCR
40.0000 meq | EXTENDED_RELEASE_TABLET | Freq: Once | ORAL | Status: AC
  Administered 2017-11-28: 40 meq via ORAL
  Filled 2017-11-28: qty 2

## 2017-11-28 MED ORDER — ENSURE ENLIVE PO LIQD
237.0000 mL | Freq: Two times a day (BID) | ORAL | Status: DC
  Administered 2017-11-29 – 2017-11-30 (×2): 237 mL via ORAL

## 2017-11-28 MED ORDER — AMLODIPINE BESYLATE 5 MG PO TABS
5.0000 mg | ORAL_TABLET | Freq: Every day | ORAL | Status: DC
  Administered 2017-11-28 – 2017-11-30 (×3): 5 mg via ORAL
  Filled 2017-11-28 (×3): qty 1

## 2017-11-28 MED ORDER — KCL IN DEXTROSE-NACL 20-5-0.9 MEQ/L-%-% IV SOLN
INTRAVENOUS | Status: AC
  Administered 2017-11-28: 16:00:00 via INTRAVENOUS

## 2017-11-28 MED ORDER — POLYETHYLENE GLYCOL 3350 17 G PO PACK
17.0000 g | PACK | Freq: Every day | ORAL | Status: DC
  Administered 2017-11-29 – 2017-11-30 (×2): 17 g via ORAL
  Filled 2017-11-28 (×2): qty 1

## 2017-11-28 MED ORDER — HYDRALAZINE HCL 20 MG/ML IJ SOLN: 5.0000 mg | INTRAMUSCULAR | Status: DC | PRN

## 2017-11-28 NOTE — Progress Notes (Signed)
Initial Nutrition Assessment  DOCUMENTATION CODES:  Not applicable  INTERVENTION:  Continues Ensure Enlive po BID, each supplement provides 350 kcal and 20 grams of protein  If patients decline is related to dementia progression, then short period of TPN would not reverse decline.   NUTRITION DIAGNOSIS:  Inadequate oral intake related to acute illnesses (pansinusitis) vs chronic illness (dementia) as evidenced by per patient/family report of poor PO intake x2.5 weeks.   GOAL:   Patient will meet greater than or equal to 90% of their needs  MONITOR:  PO intake, Supplement acceptance, Diet advancement, Labs, Weight trends  REASON FOR ASSESSMENT:  Consult Assessment of nutrition requirement/status  ASSESSMENT:  84 y/o female PMHx dementia, skin cancer, Hypothyroidism, HTN, Seizure, CAD. Presented w/ poor intake of food/liquids x 2.5 weeks w/ associated progressively worsening weakness. Admitted for management.   RD operating remotely. Per chart, patient has had poor intake for >2 weeks. Unclear if her poor intake is related to advanced dementia vs acute illness. Patient received a liter of IVF and slightly improved, but intake apparently remains more. However,  it does appear the patient consumed 50% of both her breakfast and her lunch today, which is promising.   Current wt is 119 lbs. She was 133 lbs <6 months ago. This is a clinically significant wt loss of 10.5% x 6 months.   Would continue Ensure Enlive. Will decrease to BID as 3 seems slightly excessive for her age/size. Monitor Meal intake.   Will follow up next week as able.   Labs: Albumin: 2.6, K:2.9,  Meds: Aricept, Ensure Enlive, PPI  Recent Labs  Lab 11/23/17 1345 11/27/17 1317 11/28/17 0657  NA 140 136 138  K 3.2* 3.4* 2.9*  CL 108 104 108  CO2 16* 20* 21*  BUN 25* 18 13  CREATININE 1.13* 0.91 0.66  CALCIUM 9.8 10.0 9.1  MG  --   --  1.8  GLUCOSE 149* 95 122*   NUTRITION - FOCUSED PHYSICAL EXAM: Unable  to assess  Diet Order:  Diet Heart Room service appropriate? Yes; Fluid consistency: Thin  EDUCATION NEEDS:  No education needs have been identified at this time  Skin:  Skin Assessment: Reviewed RN Assessment  Last BM:  1/3  Height:  Ht Readings from Last 1 Encounters:  11/27/17 5\' 2"  (1.575 m)   Weight:  Wt Readings from Last 1 Encounters:  11/27/17 119 lb 0.8 oz (54 kg)   Wt Readings from Last 10 Encounters:  11/27/17 119 lb 0.8 oz (54 kg)  11/23/17 131 lb (59.4 kg)  06/16/17 134 lb (60.8 kg)  06/01/17 132 lb 15 oz (60.3 kg)  02/14/17 150 lb (68 kg)  12/17/16 150 lb (68 kg)  11/27/16 150 lb (68 kg)  08/14/14 153 lb (69.4 kg)  01/30/14 153 lb 14.1 oz (69.8 kg)  07/02/13 144 lb 2.9 oz (65.4 kg)   Ideal Body Weight:  50 kg  BMI:  Body mass index is 21.77 kg/m.  Estimated Nutritional Needs:  Kcal:  1350-1500 (25-28 kcal/kg bw) Protein:  60-70g Pro (1.1-1.3 g/kg bw) Fluid:  >1.4 L fluid (25 ml/kg bw)  Burtis Junes RD, LDN, CNSC Clinical Nutrition Pager: [phone] 11/28/2017 4:12 PM

## 2017-11-28 NOTE — Progress Notes (Signed)
PROGRESS NOTE    [name] [name]  RKY:[id number]  DOB: [birth year]  DOA: 11/27/2017 PCP: [facility]   Brief Admission Hx:  [name] [name] is a 84 y.o. 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  History obtained by son due to patient's dementia.  Patient has had diminished appetite with decreased oral intake of food and liquid over the past 2-1/2 weeks.  Patient had increased weakness at that time.  She was brought to the emergency department on Monday and was diagnosed with bronchitis and sent home on a Z-Pak.  In the emergency department she did receive a liter of IV fluids and appeared to improve while in the emergency department, however the patient's appetite did not improve.  She has continued to have very little oral intake.  No palliating or provoking factors.  No fevers, chills, nausea, vomiting, diarrhea.  MDM/Assessment & Plan:   1. Generalized weakness - suspect multifactorial secondary to sinus infection, dehydration, poor oral intake and advanced dementia.  Continue current treatments.  PT evaluation pending.  2. Pansinusitis - continue antibiotics.  3. FTT - continue supportive therapy.  4. Moderate dehydration - continue IVF hydration.  5. Hypothyroidism - continue levothyroxine.  TSH WNL.  6. Essential hypertension - resuming home medications.   7. Epilepsy - stable on home topamax.  Follow with seizure precautions.   DVT prophylaxis: lovenox Code Status: DNR Family Communication: bedside Disposition Plan: TBD    Subjective: Pt says she wants to go home.  PT has not seen patient at this time.  Pt only ate a small amount.    Objective: Vitals:   11/27/17 2100 11/27/17 2216 11/27/17 2300 11/28/17 0700  BP: (!) 162/65 (!) 115/99 (!) 156/75 (!) 154/65  Pulse: 69 78 77 69  Resp: 13 19 16 18   Temp:   97.9 F (36.6 C) 98.1 F (36.7 C)  TempSrc:   Oral Oral  SpO2: 100%  99% 99% 99%  Weight:   54 kg (119 lb 0.8 oz)   Height:        Intake/Output Summary (Last 24 hours) at 11/28/2017 1320 Last data filed at 11/27/2017 2215 Gross per 24 hour  Intake 600 ml  Output -  Net 600 ml   Filed Weights   11/27/17 1238 11/27/17 2300  Weight: 59.4 kg (131 lb) 54 kg (119 lb 0.8 oz)     REVIEW OF SYSTEMS  As per history otherwise all reviewed and reported negative  Exam:  General exam: elderly female, awake, alert, NAD. Cooperative.  Respiratory system: Clear. No increased work of breathing. Cardiovascular system: S1 & S2 heard.   Gastrointestinal system: Abdomen is nondistended, soft and nontender. Normal bowel sounds heard. Central nervous system: Demented. No focal neurological deficits. Extremities: no CCE.  Data Reviewed: Basic Metabolic Panel: Recent Labs  Lab 11/23/17 1345 11/27/17 1317 11/28/17 0657  NA 140 136 138  K 3.2* 3.4* 2.9*  CL 108 104 108  CO2 16* 20* 21*  GLUCOSE 149* 95 122*  BUN 25* 18 13  CREATININE 1.13* 0.91 0.66  CALCIUM 9.8 10.0 9.1   Liver Function Tests: Recent Labs  Lab 11/23/17 1345 11/28/17 0657  AST 21 44*  ALT 11* 19  ALKPHOS 77 58  BILITOT 1.3* 0.6  PROT 7.2 5.8*  ALBUMIN 3.5 2.6*   No results for input(s): LIPASE, AMYLASE in the last 168 hours. No results for input(s): AMMONIA in the last 168  hours. CBC: Recent Labs  Lab 11/23/17 1345 11/27/17 1317 11/28/17 0657  WBC 12.8* 10.1 7.4  NEUTROABS 10.3*  --   --   HGB 13.8 13.3 12.0  HCT 42.2 40.4 35.7*  MCV 97.7 97.1 96.0  PLT 222 306 260   Cardiac Enzymes: Recent Labs  Lab 11/23/17 1359 11/27/17 1834  TROPONINI <0.03 <0.03   CBG (last 3)  No results for input(s): GLUCAP in the last 72 hours. Recent Results (from the past 240 hour(s))  Culture, blood (Routine x 2)     Status: None   Collection Time: 11/23/17  1:45 PM  Result Value Ref Range Status   Specimen Description BLOOD RIGHT FOREARM  Final   Special Requests   Final    BOTTLES  DRAWN AEROBIC AND ANAEROBIC Blood Culture adequate volume   Culture NO GROWTH 5 DAYS  Final   Report Status 11/28/2017 FINAL  Final  Culture, blood (Routine x 2)     Status: None   Collection Time: 11/23/17  1:51 PM  Result Value Ref Range Status   Specimen Description RIGHT ANTECUBITAL  Final   Special Requests   Final    BOTTLES DRAWN AEROBIC AND ANAEROBIC Blood Culture adequate volume   Culture NO GROWTH 5 DAYS  Final   Report Status 11/28/2017 FINAL  Final     Studies: Dg Chest 2 View  Result Date: 11/27/2017 CLINICAL DATA:  Continue weakness, cough and confusion. EXAM: CHEST  2 VIEW COMPARISON:  11/23/2017. FINDINGS: The patient is tilted and rotated on this exam. Stable cardiomegaly with uncoiled appearance of the thoracic aorta. Minimal atelectasis at the right lung base. Slight blunting of the right posterior and lateral costophrenic angles may reflect a tiny effusion for given slightly hyperinflated appearance of the lungs, pleural blunting. Degenerative change of the dorsal spine and included shoulders. IMPRESSION: Stable cardiomegaly with aortic atherosclerosis. No active pulmonary disease. Electronically Signed   By: Ashley Royalty M.D.   On: 11/27/2017 19:15   Ct Head Wo Contrast  Result Date: 11/27/2017 CLINICAL DATA:  Confusion, weakness, and cough. EXAM: CT HEAD WITHOUT CONTRAST TECHNIQUE: Contiguous axial images were obtained from the base of the skull through the vertex without intravenous contrast. COMPARISON:  12/17/2016 FINDINGS: Brain: There is no evidence of acute infarct, intracranial hemorrhage, mass, midline shift, or extra-axial fluid collection. Mild age related cerebral atrophy is unchanged. Periventricular white matter hypodensities are similar to the prior study and nonspecific but compatible with chronic small vessel ischemic disease, mild for age. Vascular: Calcified atherosclerosis at the skullbase. No hyperdense vessel. Skull: No fracture or focal osseous lesion.  Sinuses/Orbits: Near complete opacification of all paranasal sinuses bilaterally with scattered fluid levels. Clear mastoid air cells. Prior cataract extraction. Other: None. IMPRESSION: 1. No evidence of acute intracranial abnormality. 2. Extensive pansinusitis. Electronically Signed   By: Logan Bores M.D.   On: 11/27/2017 21:05   Scheduled Meds: . aspirin EC  81 mg Oral Daily  . donepezil  10 mg Oral QHS  . enoxaparin (LOVENOX) injection  30 mg Subcutaneous Q24H  . feeding supplement (ENSURE ENLIVE)  237 mL Oral TID BM  . levothyroxine  75 mcg Oral QAC breakfast  . loratadine  10 mg Oral Daily  . pantoprazole  40 mg Oral Daily  . simvastatin  20 mg Oral QHS  . topiramate  100 mg Oral BID   Continuous Infusions: . dextrose 5 % and 0.9% NaCl 125 mL/hr at 11/28/17 0831  . levofloxacin (LEVAQUIN) IV Stopped (  11/27/17 2215)    Principal Problem:   Failure to thrive in adult Active Problems:   Seizures ([place])   Hypothyroidism   Dementia without behavioral disturbance   Pansinusitis   Ketonuria   Essential hypertension   Generalized weakness  Time spent:   Irwin Brakeman, MD, FAAFP [facility] Pager [phone]  If 7PM-7AM, please contact night-coverage [email or web address] Password [email or web address] 11/28/2017, 1:20 PM    LOS: 0 days

## 2017-11-29 LAB — CBC
HCT: 41.1 % (ref 36.0–46.0)
Hemoglobin: 13.8 g/dL (ref 12.0–15.0)
MCH: 31.9 pg (ref 26.0–34.0)
MCHC: 33.6 g/dL (ref 30.0–36.0)
MCV: 94.9 fL (ref 78.0–100.0)
PLATELETS: 317 10*3/uL (ref 150–400)
RBC: 4.33 MIL/uL (ref 3.87–5.11)
RDW: 12.8 % (ref 11.5–15.5)
WBC: 10.2 10*3/uL (ref 4.0–10.5)

## 2017-11-29 LAB — BASIC METABOLIC PANEL
ANION GAP: 10 (ref 5–15)
BUN: 5 mg/dL — AB (ref 6–20)
CALCIUM: 9.8 mg/dL (ref 8.9–10.3)
CO2: 19 mmol/L — ABNORMAL LOW (ref 22–32)
CREATININE: 0.53 mg/dL (ref 0.44–1.00)
Chloride: 111 mmol/L (ref 101–111)
GFR calc Af Amer: >60 mL/min (ref >60)
Glucose, Bld: 126 mg/dL — ABNORMAL HIGH (ref 65–99)
Potassium: 3.7 mmol/L (ref 3.5–5.1)
Sodium: 140 mmol/L (ref 135–145)

## 2017-11-29 LAB — MAGNESIUM: MAGNESIUM: 1.8 mg/dL (ref 1.7–2.4)

## 2017-11-29 MED ORDER — MEGESTROL ACETATE 400 MG/10ML PO SUSP
400.0000 mg | Freq: Every day | ORAL | Status: DC
  Administered 2017-11-29 – 2017-11-30 (×2): 400 mg via ORAL
  Filled 2017-11-29 (×2): qty 10

## 2017-11-29 NOTE — Evaluation (Signed)
Physical Therapy Evaluation Patient Details Name: [name] [name] MRN: [id number] DOB: 10-16-1918 Today's Date: 11/29/2017   History of Present Illness  [name] [name] is a 84 y.o. 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  History obtained by son due to patient's dementia.  Patient has had diminished appetite with decreased oral intake of food and liquid over the past 2-1/2 weeks.  Patient had increased weakness at that time.  She was brought to the emergency department on Monday and was diagnosed with bronchitis and sent home on a Z-Pak.  In the emergency department she did receive a liter of IV fluids and appeared to improve while in the emergency department, however the patient's appetite did not improve.  She has continued to have very little oral intake  Clinical Impression  Pt has hx of dementia but is willing to participate in therapy and responds to all commands.  Pt states she normally Ambulates with a rolling walker.  Pt is able to get out of and into bed from a sitting position with extra time but needs Assist coming from sit to stand and even with a walker has decreased standing balance.  Ms. Laye will benefit from Skilled physical therapy to address these deficits and increase her safety.     Follow Up Recommendations Home health PT    Equipment Recommendations  None recommended by PT    Recommendations for Other Services       Precautions / Restrictions        Mobility  Bed Mobility Overal bed mobility: Modified Independent                Transfers Overall transfer level: Needs assistance   Transfers: Sit to/from Stand Sit to Stand: Min assist            Ambulation/Gait Ambulation/Gait assistance: Supervision Ambulation Distance (Feet): 135 Feet Assistive device: Rolling walker (2 wheeled) Gait Pattern/deviations: Trunk flexed;Decreased step length - left;Decreased step length  - right        Stairs            Wheelchair Mobility    Modified Rankin (Stroke Patients Only)       Balance    sitting fair -  Standing fair -                                         Pertinent Vitals/Pain Pain Assessment: No/denies pain    Home Living                        Prior Function                    Extremity/Trunk Assessment        Lower Extremity Assessment Lower Extremity Assessment: Generalized weakness       Communication      Cognition Arousal/Alertness: Awake/alert Behavior During Therapy: WFL for tasks assessed/performed Overall Cognitive Status: History of cognitive impairments - at baseline                                               Assessment/Plan    PT Assessment Patient needs continued PT services  PT Problem List Decreased strength;Decreased  balance       PT Treatment Interventions Gait training;Functional mobility training;Therapeutic activities    PT Goals (Current goals can be found in the Care Plan section)  Acute Rehab PT Goals PT Goal Formulation: Patient unable to participate in goal setting Time For Goal Achievement: 12/04/17 Potential to Achieve Goals: Good    Frequency Min 3X/week              End of Session Equipment Utilized During Treatment: Gait belt Activity Tolerance: Patient tolerated treatment well Patient left: in bed;with bed alarm set   PT Visit Diagnosis: Unsteadiness on feet (R26.81);Muscle weakness (generalized) (M62.81)    Time: 4081-4481 PT Time Calculation (min) (ACUTE ONLY): 27 min   Charges:   PT Evaluation $PT Eval Low Complexity: [place], PT CLT [phone] 11/29/2017, 1:27 PM

## 2017-11-29 NOTE — Progress Notes (Signed)
PROGRESS NOTE    [name] [name]  PXT:[id number]  DOB: 03-Apr-1918  DOA: 11/27/2017 PCP: [facility]   Brief Admission Hx:  [name] [name] is a 84 y.o. 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  History obtained by son due to patient's dementia.  Patient has had diminished appetite with decreased oral intake of food and liquid over the past 2-1/2 weeks.  Patient had increased weakness at that time.  She was brought to the emergency department on Monday and was diagnosed with bronchitis and sent home on a Z-Pak.  In the emergency department she did receive a liter of IV fluids and appeared to improve while in the emergency department, however the patient's appetite did not improve.  She has continued to have very little oral intake.  No palliating or provoking factors.  No fevers, chills, nausea, vomiting, diarrhea.  MDM/Assessment & Plan:   1. Generalized weakness - suspect multifactorial secondary to sinus infection, dehydration, poor oral intake and advanced dementia.  Continue current treatments.  PT evaluation pending.  2. Pansinusitis - continue antibiotics.  3. Abnormal weight loss - Pt has lost significant amt of weight in last 6 months and oral intake has continued to decline.  I suspect that this is all related to her dementia. Will try to encourage appetite with megace 400 mg daily.  Appreciate dietician recommendations.   4. Moderate dehydration - continue IVF hydration.  5. Hypothyroidism - continue levothyroxine.  TSH WNL.  6. Essential hypertension - added amlodipine 5 mg po daily.   7. Epilepsy - stable on home topamax.  Follow with seizure precautions.   DVT prophylaxis: lovenox Code Status: DNR Family Communication: bedside Disposition Plan: TBD   Subjective: Pt says that she will try to eat this morning. She denies nausea or abdominal pain.     Objective: Vitals:   11/28/17 1440 11/28/17 1757 11/28/17 2211 11/29/17 0655  BP: (!) 180/84 (!) 144/97 (!) 161/76 (!) 146/55  Pulse: 76  79 71  Resp: 18  18 18   Temp: 98.3 F (36.8 C)     TempSrc: Oral     SpO2: 98%  98% 99%  Weight:      Height:        Intake/Output Summary (Last 24 hours) at 11/29/2017 0913 Last data filed at 11/29/2017 6270 Gross per 24 hour  Intake 510 ml  Output 950 ml  Net -440 ml   Filed Weights   11/27/17 1238 11/27/17 2300  Weight: 59.4 kg (131 lb) 54 kg (119 lb 0.8 oz)    REVIEW OF SYSTEMS  As per history otherwise all reviewed and reported negative  Exam:  General exam: elderly female, awake, alert, NAD. Cooperative.  Respiratory system: Clear. No increased work of breathing. Cardiovascular system: S1 & S2 heard.   Gastrointestinal system: Abdomen is nondistended, soft and nontender. Normal bowel sounds heard. Central nervous system: Demented. No focal neurological deficits. Extremities: no CCE.  Data Reviewed: Basic Metabolic Panel: Recent Labs  Lab 11/23/17 1345 11/27/17 1317 11/28/17 0657 11/29/17 0738  NA 140 136 138 140  K 3.2* 3.4* 2.9* 3.7  CL 108 104 108 111  CO2 16* 20* 21* 19*  GLUCOSE 149* 95 122* 126*  BUN 25* 18 13 5*  CREATININE 1.13* 0.91 0.66 0.53  CALCIUM 9.8 10.0 9.1 9.8  MG  --   --  1.8 1.8   Liver Function Tests: Recent Labs  Lab  11/23/17 1345 11/28/17 0657  AST 21 44*  ALT 11* 19  ALKPHOS 77 58  BILITOT 1.3* 0.6  PROT 7.2 5.8*  ALBUMIN 3.5 2.6*   No results for input(s): LIPASE, AMYLASE in the last 168 hours. No results for input(s): AMMONIA in the last 168 hours. CBC: Recent Labs  Lab 11/23/17 1345 11/27/17 1317 11/28/17 0657 11/29/17 0738  WBC 12.8* 10.1 7.4 10.2  NEUTROABS 10.3*  --   --   --   HGB 13.8 13.3 12.0 13.8  HCT 42.2 40.4 35.7* 41.1  MCV 97.7 97.1 96.0 94.9  PLT 222 306 260 317   Cardiac Enzymes: Recent Labs  Lab 11/23/17 1359 11/27/17 1834  TROPONINI <0.03 <0.03   CBG (last 3)  Recent  Labs    11/28/17 1705  GLUCAP 125*   Recent Results (from the past 240 hour(s))  Culture, blood (Routine x 2)     Status: None   Collection Time: 11/23/17  1:45 PM  Result Value Ref Range Status   Specimen Description BLOOD RIGHT FOREARM  Final   Special Requests   Final    BOTTLES DRAWN AEROBIC AND ANAEROBIC Blood Culture adequate volume   Culture NO GROWTH 5 DAYS  Final   Report Status 11/28/2017 FINAL  Final  Culture, blood (Routine x 2)     Status: None   Collection Time: 11/23/17  1:51 PM  Result Value Ref Range Status   Specimen Description RIGHT ANTECUBITAL  Final   Special Requests   Final    BOTTLES DRAWN AEROBIC AND ANAEROBIC Blood Culture adequate volume   Culture NO GROWTH 5 DAYS  Final   Report Status 11/28/2017 FINAL  Final     Studies: Dg Chest 2 View  Result Date: 11/27/2017 CLINICAL DATA:  Continue weakness, cough and confusion. EXAM: CHEST  2 VIEW COMPARISON:  11/23/2017. FINDINGS: The patient is tilted and rotated on this exam. Stable cardiomegaly with uncoiled appearance of the thoracic aorta. Minimal atelectasis at the right lung base. Slight blunting of the right posterior and lateral costophrenic angles may reflect a tiny effusion for given slightly hyperinflated appearance of the lungs, pleural blunting. Degenerative change of the dorsal spine and included shoulders. IMPRESSION: Stable cardiomegaly with aortic atherosclerosis. No active pulmonary disease. Electronically Signed   By: Ashley Royalty M.D.   On: 11/27/2017 19:15   Ct Head Wo Contrast  Result Date: 11/27/2017 CLINICAL DATA:  Confusion, weakness, and cough. EXAM: CT HEAD WITHOUT CONTRAST TECHNIQUE: Contiguous axial images were obtained from the base of the skull through the vertex without intravenous contrast. COMPARISON:  12/17/2016 FINDINGS: Brain: There is no evidence of acute infarct, intracranial hemorrhage, mass, midline shift, or extra-axial fluid collection. Mild age related cerebral atrophy is  unchanged. Periventricular white matter hypodensities are similar to the prior study and nonspecific but compatible with chronic small vessel ischemic disease, mild for age. Vascular: Calcified atherosclerosis at the skullbase. No hyperdense vessel. Skull: No fracture or focal osseous lesion. Sinuses/Orbits: Near complete opacification of all paranasal sinuses bilaterally with scattered fluid levels. Clear mastoid air cells. Prior cataract extraction. Other: None. IMPRESSION: 1. No evidence of acute intracranial abnormality. 2. Extensive pansinusitis. Electronically Signed   By: Logan Bores M.D.   On: 11/27/2017 21:05   Scheduled Meds: . amLODipine  5 mg Oral Daily  . aspirin EC  81 mg Oral Daily  . docusate sodium  100 mg Oral BID  . donepezil  10 mg Oral QHS  . enoxaparin (LOVENOX) injection  30 mg Subcutaneous Q24H  . feeding supplement (ENSURE ENLIVE)  237 mL Oral BID BM  . levothyroxine  75 mcg Oral QAC breakfast  . loratadine  10 mg Oral Daily  . megestrol  400 mg Oral Daily  . pantoprazole  40 mg Oral Daily  . polyethylene glycol  17 g Oral Daily  . simvastatin  20 mg Oral QHS  . topiramate  100 mg Oral BID   Continuous Infusions: . dextrose 5 % and 0.9 % NaCl with KCl 20 mEq/L 50 mL/hr at 11/28/17 1602  . levofloxacin (LEVAQUIN) IV Stopped (11/27/17 2215)    Principal Problem:   Failure to thrive in adult Active Problems:   Seizures ([place])   Hypothyroidism   Dementia without behavioral disturbance   Pansinusitis   Ketonuria   Essential hypertension   Generalized weakness  Time spent:   Irwin Brakeman, MD, FAAFP [facility] Pager [phone]  If 7PM-7AM, please contact night-coverage [email or web address] Password [email or web address] 11/29/2017, 9:13 AM    LOS: 1 day

## 2017-11-30 MED ORDER — ENSURE ENLIVE PO LIQD: 237.0000 mL | Freq: Two times a day (BID) | ORAL | 0 refills | Status: AC

## 2017-11-30 MED ORDER — POLYETHYLENE GLYCOL 3350 17 G PO PACK: 17.0000 g | PACK | Freq: Every day | ORAL | 0 refills | Status: DC | PRN

## 2017-11-30 MED ORDER — DOXYCYCLINE HYCLATE 100 MG PO CAPS: 100.0000 mg | ORAL_CAPSULE | Freq: Every day | ORAL | 0 refills | Status: AC

## 2017-11-30 MED ORDER — AMLODIPINE BESYLATE 5 MG PO TABS: 5.0000 mg | ORAL_TABLET | Freq: Every day | ORAL | 0 refills | Status: DC

## 2017-11-30 MED ORDER — MEGESTROL ACETATE 400 MG/10ML PO SUSP: 400.0000 mg | Freq: Every day | ORAL | 0 refills | Status: AC

## 2017-11-30 NOTE — Care Management Note (Signed)
Case Management Note  Patient Details  Name: [name] [name] MRN: [id number] Date of Birth: 08/22/1918       Pt from home, lives with family, has 24/7 care pta. She uses a cane with ambulation, has never had [place] services PTA. Per son Laverna Peace) has no difficulty with obtaining medications, compliance or transportation. Pt discharging home today with [facility] RN/PT ordered. Pt/family are agreeable to [facility] and have chosen [facility] from list of [facility] providers. Pt/family aware HH has 48 hrs to make fist visit. Tresea Mall, [facility] rep, aware of referral and will pull pt info from chart. Pt's family will provide transport home.               Expected Discharge Date:  11/30/17               Expected Discharge Plan:  [place]  In-House Referral:  NA  Discharge planning Services  CM Consult  Post Acute Care Choice:  Home Health Choice offered to:  Adult Children  HH Arranged:  RN, PT HH Agency:  [facility]  Status of Service:  Completed, signed off  Sherald Barge, RN 11/30/2017, 12:06 PM

## 2017-11-30 NOTE — Discharge Summary (Signed)
Physician Discharge Summary  [name] [name] WUJ:[id number] DOB: 01/11/1918 DOA: 11/27/2017  PCP: [facility] date: 11/27/2017 Discharge date: 11/30/2017  Admitted From:  Home  Disposition: Home with home health services Recommendations for Outpatient Follow-up:  1. Follow up with PCP in 1 weeks 2. Please obtain BMP/CBC in one week 3. Please follow up on the following pending results: final culture data  Home Health: PT, RN, SW  Discharge Condition: STABLE  CODE STATUS: DNR   Brief Hospitalization Summary: Please see all hospital notes, images, labs for full details of the hospitalization. HPI: [name] [name] is a 84 y.o. 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  History obtained by son due to patient's dementia.  Patient has had diminished appetite with decreased oral intake of food and liquid over the past 2-1/2 weeks.  Patient had increased weakness at that time.  She was brought to the emergency department on Monday and was diagnosed with bronchitis and sent home on a Z-Pak.  In the emergency department she did receive a liter of IV fluids and appeared to improve while in the emergency department, however the patient's appetite did not improve.  She has continued to have very little oral intake.  No palliating or provoking factors.  No fevers, chills, nausea, vomiting, diarrhea.  Emergency Department Course: CT scan of the head shows no acute cranial process.  She does have pansinusitis.  Chest x-ray normal.  White count normal.  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 Patient has had diminished appetite with decreased oral intake of food and liquid over the past 2-1/2  weeks. Patient had increased weakness at that time. She was brought to the emergency department on Monday and was diagnosed with bronchitis and sent home on a Z-Pak. In the emergency department she did receive a liter of IV fluids and appeared to improve while in the emergency department, however the patient's appetite did not improve. She has continued to have very little oral intake. No palliating or provoking factors. No fevers, chills, nausea, vomiting, diarrhea.  MDM/Assessment & Plan:   1. Generalized weakness - suspect multifactorial secondary to sinus infection, dehydration, poor oral intake and advanced dementia.  Continue current treatments.  PT evaluation recommending Home health PT.   2. Pansinusitis - continue antibiotics for 7 more days.  3. Abnormal weight loss - Pt has lost significant amt of weight in last 6 months and oral intake has continued to decline.  I suspect that this is all related to her dementia. Will try to encourage appetite with megace 400 mg daily.  Appreciate dietician recommendations.  Discussed with family that if the patient continues to eat and drink poorly they are going to have to make decisions about nutrition including possible PEG placement.  I am hopeful that the Megace will stimulate her appetite further and she will begin eating better.  I expressed to them how important it was for them to continue encouraging fluid intake.  I arranged for home health nurse and social worker to assist family with making difficult decisions regarding her advanced dementia and not eating well.  4. Moderate dehydration - treated with IVF hydration.  5. Hypothyroidism - continue levothyroxine.  TSH WNL.  6. Essential hypertension - added  amlodipine 5 mg po daily.    Follow-up with primary care clinic to recheck. 7. Epilepsy - stable on home topamax.  Follow with seizure precautions.   DVT prophylaxis: lovenox Code Status: DNR Family Communication: bedside Disposition  Plan: Home with home health services   Discharge Diagnoses:  Principal Problem:   Failure to thrive in adult Active Problems:   Seizures ([place])   Hypothyroidism   Dementia without behavioral disturbance   Pansinusitis   Ketonuria   Essential hypertension   Generalized weakness  Discharge Instructions: Discharge Instructions    Call MD for:  difficulty breathing, headache or visual disturbances   Complete by:  As directed    Call MD for:  extreme fatigue   Complete by:  As directed    Call MD for:  persistant dizziness or light-headedness   Complete by:  As directed    Call MD for:  persistant nausea and vomiting   Complete by:  As directed    Call MD for:  severe uncontrolled pain   Complete by:  As directed    Increase activity slowly   Complete by:  As directed      Allergies as of 11/30/2017      Reactions   Feldene [piroxicam] Rash   Pt denies allergy to this medication      Medication List    STOP taking these medications   azithromycin 250 MG tablet Commonly known as:  ZITHROMAX     TAKE these medications   acetaminophen 325 MG tablet Commonly known as:  TYLENOL Take 650 mg by mouth every 6 (six) hours as needed for headache.   amLODipine 5 MG tablet Commonly known as:  NORVASC Take 1 tablet (5 mg total) by mouth daily. Start taking on:  12/01/2017   aspirin EC 81 MG tablet Take 81 mg by mouth daily.   CALCIUM 600 + D PO Take 1 tablet by mouth daily.   donepezil 10 MG tablet Commonly known as:  ARICEPT Take 10 mg by mouth at bedtime.   doxycycline 100 MG capsule Commonly known as:  VIBRAMYCIN Take 1 capsule (100 mg total) by mouth daily for 7 days.   feeding supplement (ENSURE ENLIVE) Liqd Take 237 mLs by mouth 2 (two) times daily between meals.   HYDROcodone-acetaminophen 10-325 MG tablet Commonly known as:  NORCO Take 1 tablet by mouth every 6 (six) hours as needed for moderate pain. For pain at bedtime.   levocetirizine 2.5 MG/5ML  solution Commonly known as:  XYZAL Take 5 mLs by mouth daily.   levothyroxine 75 MCG tablet Commonly known as:  SYNTHROID, LEVOTHROID Take 75 mcg by mouth daily before breakfast.   megestrol 400 MG/10ML suspension Commonly known as:  MEGACE Take 10 mLs (400 mg total) by mouth daily. Start taking on:  12/01/2017   omeprazole 20 MG capsule Commonly known as:  PRILOSEC Take 20 mg by mouth every morning.   polyethylene glycol packet Commonly known as:  MIRALAX / GLYCOLAX Take 17 g by mouth daily as needed for mild constipation.   simvastatin 20 MG tablet Commonly known as:  ZOCOR Take 20 mg by mouth at bedtime.   topiramate 100 MG tablet Commonly known as:  TOPAMAX Take 1 tablet (100 mg total) by mouth 2 (two) times daily.   Vitamin B-12 2500 MCG Subl Place 1 tablet under the tongue daily.   vitamin C 500 MG tablet Commonly known as:  ASCORBIC ACID Take 500 mg by mouth daily.  Follow-up Information    [facility] Schedule an appointment as soon as possible for a visit in 1 week(s).   Why:  Hospital follow-up Contact information: [address] [address] [phone]          Allergies  Allergen Reactions  . Feldene [Piroxicam] Rash    Pt denies allergy to this medication   Allergies as of 11/30/2017      Reactions   Feldene [piroxicam] Rash   Pt denies allergy to this medication      Medication List    STOP taking these medications   azithromycin 250 MG tablet Commonly known as:  ZITHROMAX     TAKE these medications   acetaminophen 325 MG tablet Commonly known as:  TYLENOL Take 650 mg by mouth every 6 (six) hours as needed for headache.   amLODipine 5 MG tablet Commonly known as:  NORVASC Take 1 tablet (5 mg total) by mouth daily. Start taking on:  12/01/2017   aspirin EC 81 MG tablet Take 81 mg by mouth daily.   CALCIUM 600 + D PO Take 1 tablet by mouth daily.   donepezil 10 MG tablet Commonly known  as:  ARICEPT Take 10 mg by mouth at bedtime.   doxycycline 100 MG capsule Commonly known as:  VIBRAMYCIN Take 1 capsule (100 mg total) by mouth daily for 7 days.   feeding supplement (ENSURE ENLIVE) Liqd Take 237 mLs by mouth 2 (two) times daily between meals.   HYDROcodone-acetaminophen 10-325 MG tablet Commonly known as:  NORCO Take 1 tablet by mouth every 6 (six) hours as needed for moderate pain. For pain at bedtime.   levocetirizine 2.5 MG/5ML solution Commonly known as:  XYZAL Take 5 mLs by mouth daily.   levothyroxine 75 MCG tablet Commonly known as:  SYNTHROID, LEVOTHROID Take 75 mcg by mouth daily before breakfast.   megestrol 400 MG/10ML suspension Commonly known as:  MEGACE Take 10 mLs (400 mg total) by mouth daily. Start taking on:  12/01/2017   omeprazole 20 MG capsule Commonly known as:  PRILOSEC Take 20 mg by mouth every morning.   polyethylene glycol packet Commonly known as:  MIRALAX / GLYCOLAX Take 17 g by mouth daily as needed for mild constipation.   simvastatin 20 MG tablet Commonly known as:  ZOCOR Take 20 mg by mouth at bedtime.   topiramate 100 MG tablet Commonly known as:  TOPAMAX Take 1 tablet (100 mg total) by mouth 2 (two) times daily.   Vitamin B-12 2500 MCG Subl Place 1 tablet under the tongue daily.   vitamin C 500 MG tablet Commonly known as:  ASCORBIC ACID Take 500 mg by mouth daily.       Procedures/Studies: Dg Chest 2 View  Result Date: 11/27/2017 CLINICAL DATA:  Continue weakness, cough and confusion. EXAM: CHEST  2 VIEW COMPARISON:  11/23/2017. FINDINGS: The patient is tilted and rotated on this exam. Stable cardiomegaly with uncoiled appearance of the thoracic aorta. Minimal atelectasis at the right lung base. Slight blunting of the right posterior and lateral costophrenic angles may reflect a tiny effusion for given slightly hyperinflated appearance of the lungs, pleural blunting. Degenerative change of the dorsal spine and  included shoulders. IMPRESSION: Stable cardiomegaly with aortic atherosclerosis. No active pulmonary disease. Electronically Signed   By: Ashley Royalty M.D.   On: 11/27/2017 19:15   Dg Chest 2 View  Result Date: 11/23/2017 CLINICAL DATA:  Coughing congestion. EXAM: CHEST  2 VIEW  COMPARISON:  CT scan 06/01/2017.  Chest x-ray 05/31/2017. FINDINGS: Lungs are hyperexpanded. The cardiopericardial silhouette is within normal limits for size. Interstitial markings are diffusely coarsened with chronic features. Bones are diffusely demineralized. IMPRESSION: Stable.  No acute cardiopulmonary findings. Electronically Signed   By: Misty Stanley M.D.   On: 11/23/2017 14:08   Ct Head Wo Contrast  Result Date: 11/27/2017 CLINICAL DATA:  Confusion, weakness, and cough. EXAM: CT HEAD WITHOUT CONTRAST TECHNIQUE: Contiguous axial images were obtained from the base of the skull through the vertex without intravenous contrast. COMPARISON:  12/17/2016 FINDINGS: Brain: There is no evidence of acute infarct, intracranial hemorrhage, mass, midline shift, or extra-axial fluid collection. Mild age related cerebral atrophy is unchanged. Periventricular white matter hypodensities are similar to the prior study and nonspecific but compatible with chronic small vessel ischemic disease, mild for age. Vascular: Calcified atherosclerosis at the skullbase. No hyperdense vessel. Skull: No fracture or focal osseous lesion. Sinuses/Orbits: Near complete opacification of all paranasal sinuses bilaterally with scattered fluid levels. Clear mastoid air cells. Prior cataract extraction. Other: None. IMPRESSION: 1. No evidence of acute intracranial abnormality. 2. Extensive pansinusitis. Electronically Signed   By: Logan Bores M.D.   On: 11/27/2017 21:05      Subjective: Pt without complaints.  She says that she will try eating and drinking more today. She is asking about going home.    Discharge Exam: Vitals:   11/29/17 1400 11/29/17 2100   BP: 138/61 126/71  Pulse: 74 72  Resp: 18 19  Temp: 98.2 F (36.8 C) 98.1 F (36.7 C)  SpO2: 98% 99%   Vitals:   11/28/17 2211 11/29/17 0655 11/29/17 1400 11/29/17 2100  BP: (!) 161/76 (!) 146/55 138/61 126/71  Pulse: 79 71 74 72  Resp: 18 18 18 19   Temp:   98.2 F (36.8 C) 98.1 F (36.7 C)  TempSrc:   Oral Oral  SpO2: 98% 99% 98% 99%  Weight:      Height:       General: Pt is alert, awake, not in acute distress Cardiovascular: RRR, S1/S2 +, no rubs, no gallops Respiratory: CTA bilaterally, no wheezing, no rhonchi Abdominal: Soft, NT, ND, bowel sounds + Extremities: no edema, no cyanosis   The results of significant diagnostics from this hospitalization (including imaging, microbiology, ancillary and laboratory) are listed below for reference.     Microbiology: Recent Results (from the past 240 hour(s))  Culture, blood (Routine x 2)     Status: None   Collection Time: 11/23/17  1:45 PM  Result Value Ref Range Status   Specimen Description BLOOD RIGHT FOREARM  Final   Special Requests   Final    BOTTLES DRAWN AEROBIC AND ANAEROBIC Blood Culture adequate volume   Culture NO GROWTH 5 DAYS  Final   Report Status 11/28/2017 FINAL  Final  Culture, blood (Routine x 2)     Status: None   Collection Time: 11/23/17  1:51 PM  Result Value Ref Range Status   Specimen Description RIGHT ANTECUBITAL  Final   Special Requests   Final    BOTTLES DRAWN AEROBIC AND ANAEROBIC Blood Culture adequate volume   Culture NO GROWTH 5 DAYS  Final   Report Status 11/28/2017 FINAL  Final     Labs: BNP (last 3 results) Recent Labs    05/31/17 2342 11/23/17 1358 11/27/17 1800  BNP 281.0* 208.0* 469.6*   Basic Metabolic Panel: Recent Labs  Lab 11/23/17 1345 11/27/17 1317 11/28/17 0657 11/29/17 0738  NA  140 136 138 140  K 3.2* 3.4* 2.9* 3.7  CL 108 104 108 111  CO2 16* 20* 21* 19*  GLUCOSE 149* 95 122* 126*  BUN 25* 18 13 5*  CREATININE 1.13* 0.91 0.66 0.53  CALCIUM 9.8 10.0  9.1 9.8  MG  --   --  1.8 1.8   Liver Function Tests: Recent Labs  Lab 11/23/17 1345 11/28/17 0657  AST 21 44*  ALT 11* 19  ALKPHOS 77 58  BILITOT 1.3* 0.6  PROT 7.2 5.8*  ALBUMIN 3.5 2.6*   No results for input(s): LIPASE, AMYLASE in the last 168 hours. No results for input(s): AMMONIA in the last 168 hours. CBC: Recent Labs  Lab 11/23/17 1345 11/27/17 1317 11/28/17 0657 11/29/17 0738  WBC 12.8* 10.1 7.4 10.2  NEUTROABS 10.3*  --   --   --   HGB 13.8 13.3 12.0 13.8  HCT 42.2 40.4 35.7* 41.1  MCV 97.7 97.1 96.0 94.9  PLT 222 306 260 317   Cardiac Enzymes: Recent Labs  Lab 11/23/17 1359 11/27/17 1834  TROPONINI <0.03 <0.03   BNP: Invalid input(s): POCBNP CBG: Recent Labs  Lab 11/28/17 1705  GLUCAP 125*   D-Dimer No results for input(s): DDIMER in the last 72 hours. Hgb A1c No results for input(s): HGBA1C in the last 72 hours. Lipid Profile No results for input(s): CHOL, HDL, LDLCALC, TRIG, CHOLHDL, LDLDIRECT in the last 72 hours. Thyroid function studies Recent Labs    11/28/17 0657  TSH 1.233   Anemia work up No results for input(s): VITAMINB12, FOLATE, FERRITIN, TIBC, IRON, RETICCTPCT in the last 72 hours. Urinalysis    Component Value Date/Time   COLORURINE YELLOW 11/27/2017 1930   APPEARANCEUR CLEAR 11/27/2017 1930   LABSPEC 1.020 11/27/2017 1930   PHURINE 6.0 11/27/2017 1930   GLUCOSEU NEGATIVE 11/27/2017 1930   HGBUR NEGATIVE 11/27/2017 1930   [place] NEGATIVE 11/27/2017 1930   KETONESUR 80 (A) 11/27/2017 1930   PROTEINUR NEGATIVE 11/27/2017 1930   UROBILINOGEN 0.2 06/03/2015 1415   NITRITE NEGATIVE 11/27/2017 1930   LEUKOCYTESUR NEGATIVE 11/27/2017 1930   Sepsis Labs Invalid input(s): PROCALCITONIN,  WBC,  LACTICIDVEN Microbiology Recent Results (from the past 240 hour(s))  Culture, blood (Routine x 2)     Status: None   Collection Time: 11/23/17  1:45 PM  Result Value Ref Range Status   Specimen Description BLOOD RIGHT  FOREARM  Final   Special Requests   Final    BOTTLES DRAWN AEROBIC AND ANAEROBIC Blood Culture adequate volume   Culture NO GROWTH 5 DAYS  Final   Report Status 11/28/2017 FINAL  Final  Culture, blood (Routine x 2)     Status: None   Collection Time: 11/23/17  1:51 PM  Result Value Ref Range Status   Specimen Description RIGHT ANTECUBITAL  Final   Special Requests   Final    BOTTLES DRAWN AEROBIC AND ANAEROBIC Blood Culture adequate volume   Culture NO GROWTH 5 DAYS  Final   Report Status 11/28/2017 FINAL  Final   Time coordinating discharge: 35 mins  SIGNED:  Irwin Brakeman, MD  [facility] 11/30/2017, 11:51 AM Pager [phone]  If 7PM-7AM, please contact night-coverage [email or web address] Password [email or web address]

## 2017-11-30 NOTE — Care Management Important Message (Signed)
Important Message  Patient Details  Name: [name] [name] MRN: [id number] Date of Birth: 26-Apr-1918   Medicare Important Message Given:  Yes    Sherald Barge, RN 11/30/2017, 11:57 AM

## 2017-11-30 NOTE — Progress Notes (Signed)
Family states understanding of discharge instructions

## 2018-11-06 IMAGING — CT CT HEAD W/O CM
3 series · 15 of 47 positions shown, 18 images · non-contrast
Comparison: 12/17/2016

CLINICAL DATA: Confusion, weakness, and cough.

EXAM:
CT HEAD WITHOUT CONTRAST
TECHNIQUE: Contiguous axial images were obtained from the base of the skull
through the vertex without intravenous contrast.

[Series 2: head wo · axial · 0.43mm/px · z∈[-69,+56]mm · 9 of 31 slices shown, 12 images]
[im 3/31  brain]
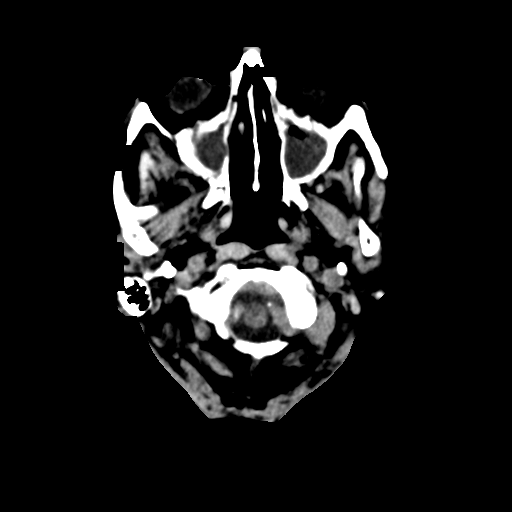
[im 3/31  bone]
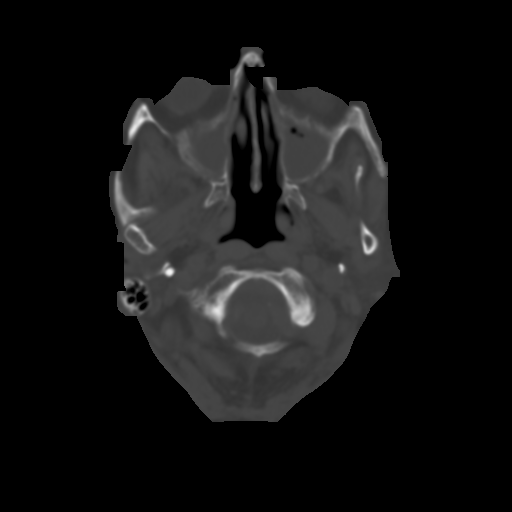
[im 6/31  brain]
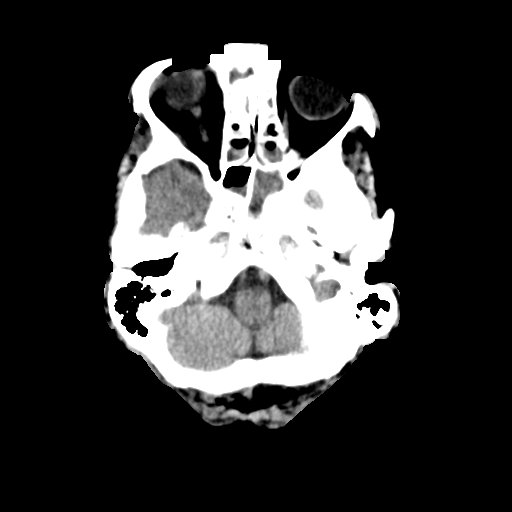
[im 9/31  brain]
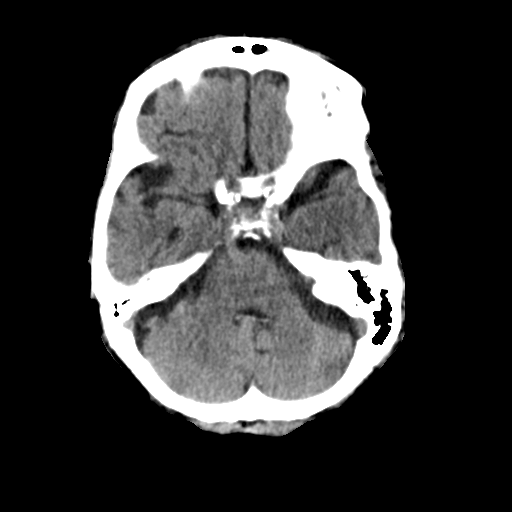
[im 12/31  brain]
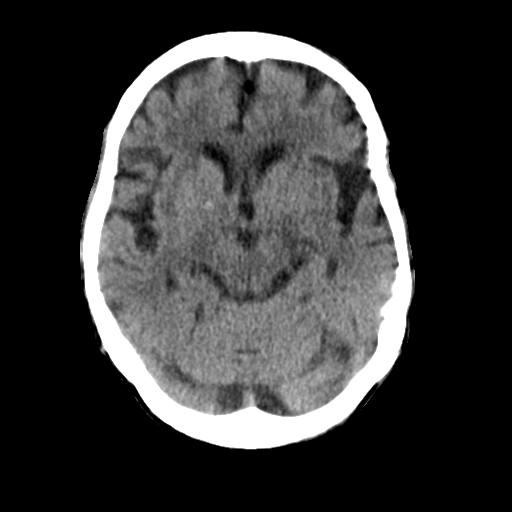
[im 16/31  brain]
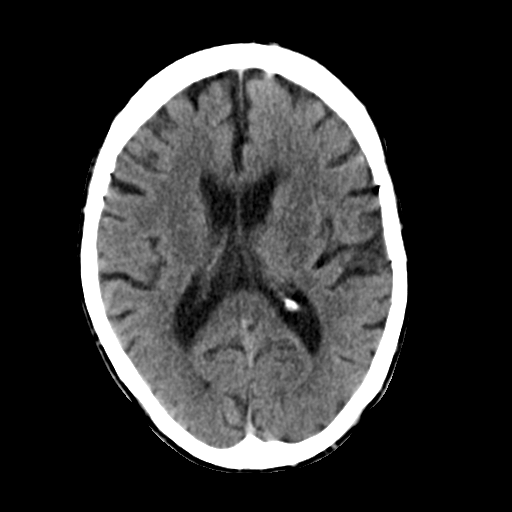
[im 16/31  bone]
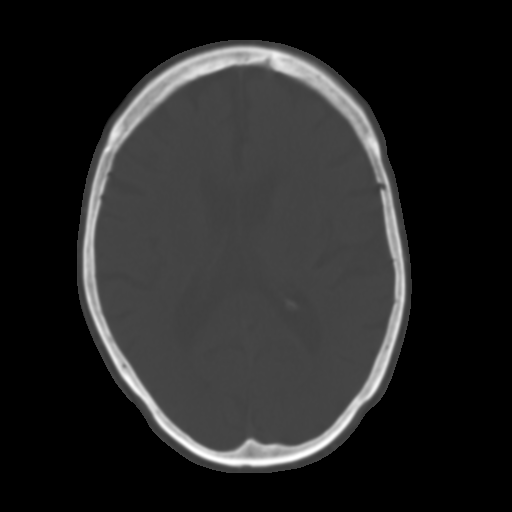
[im 19/31  brain]
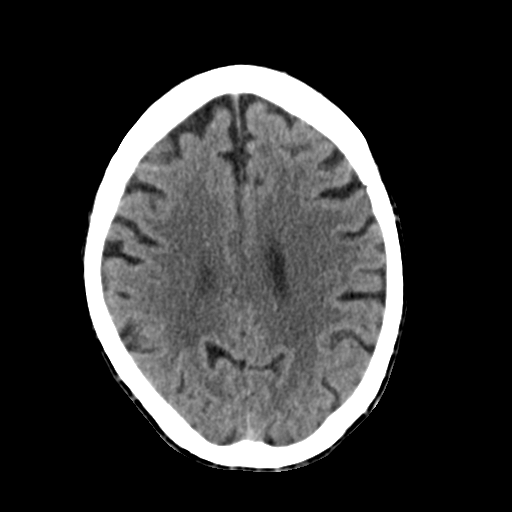
[im 22/31  brain]
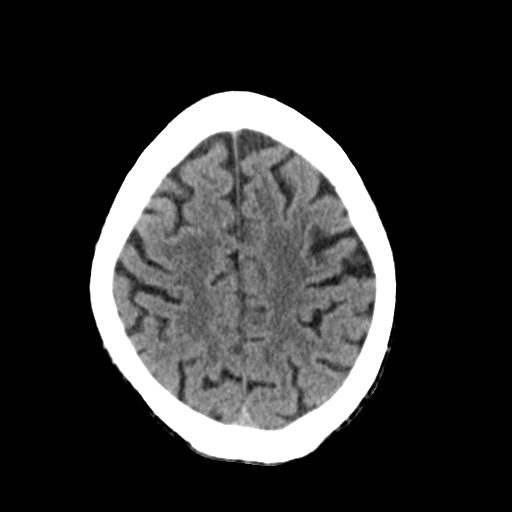
[im 25/31  brain]
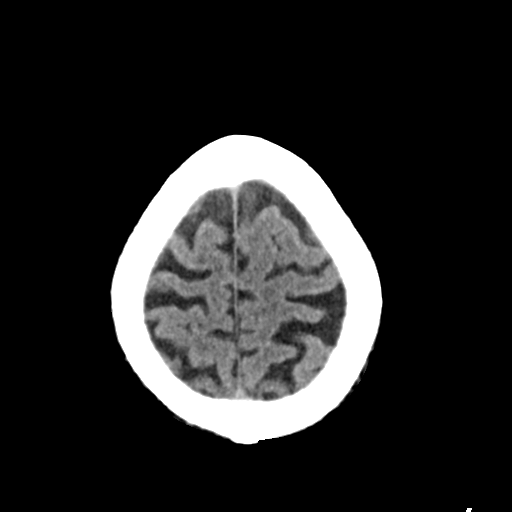
[im 28/31  brain]
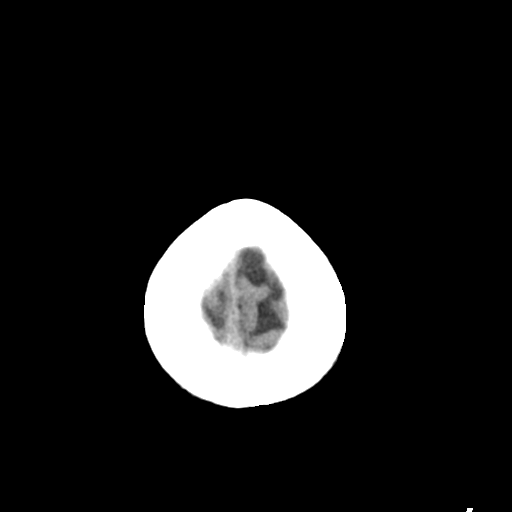
[im 28/31  bone]
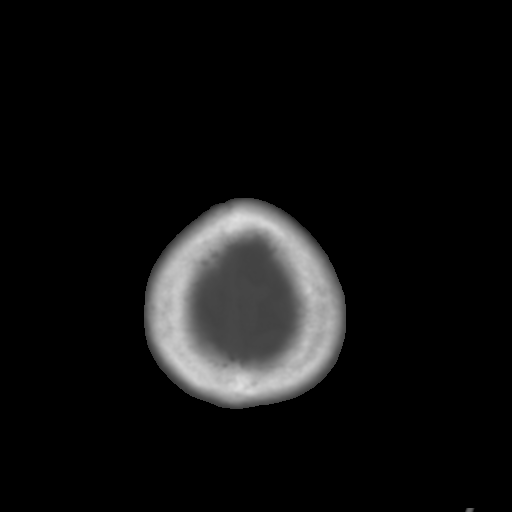

[Series 4: coronal soft tissue · coronal · 0.31mm/px · 3 of 68 slices shown]
[im 23/68  brain]
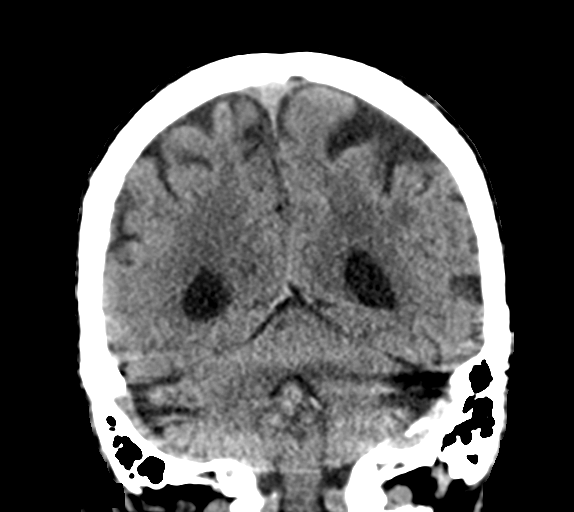
[im 30/68  brain]
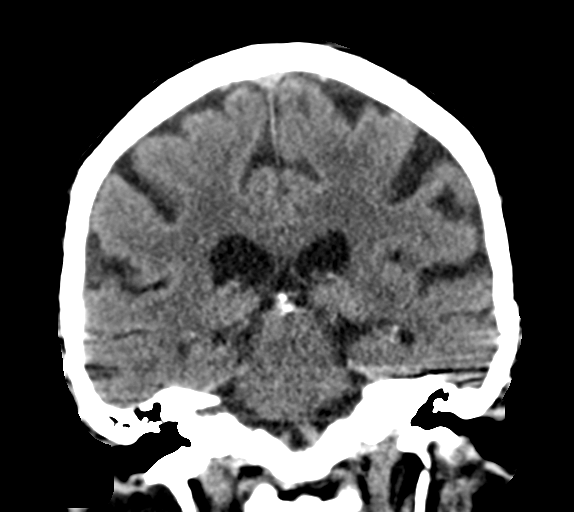
[im 38/68  brain]
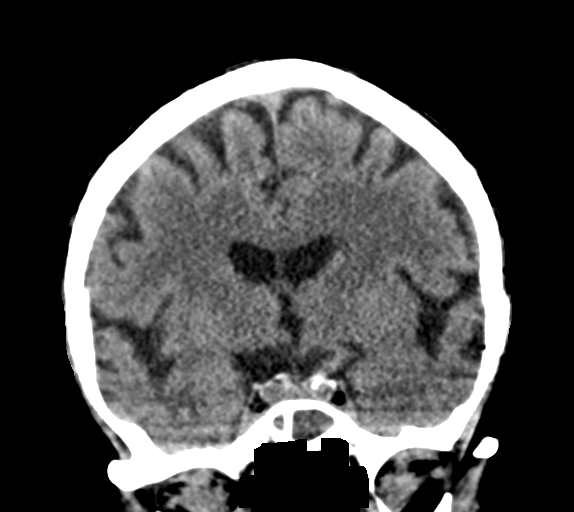

[Series 5: sagittal soft tissue · sagittal · 0.33mm/px · 3 of 53 slices shown]
[im 18/53  brain]
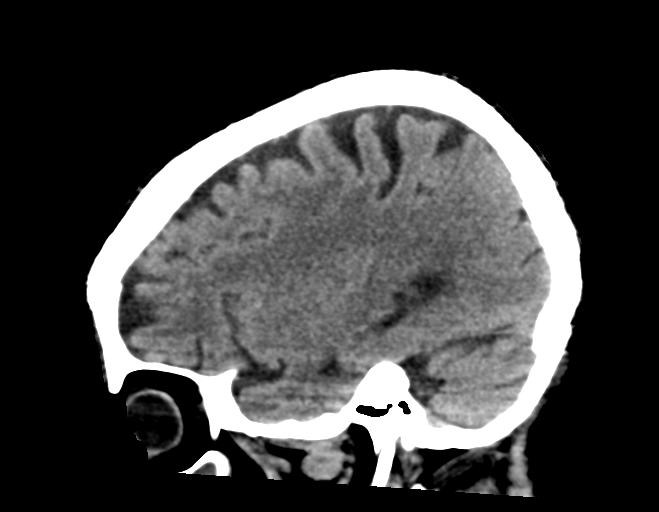
[im 27/53  brain]
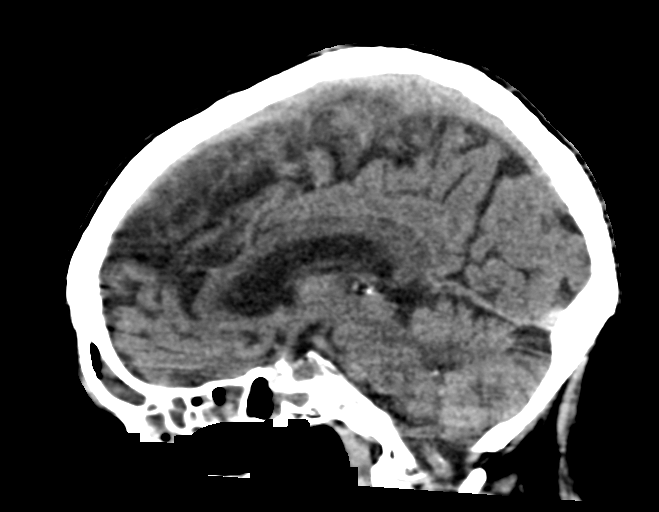
[im 35/53  brain]
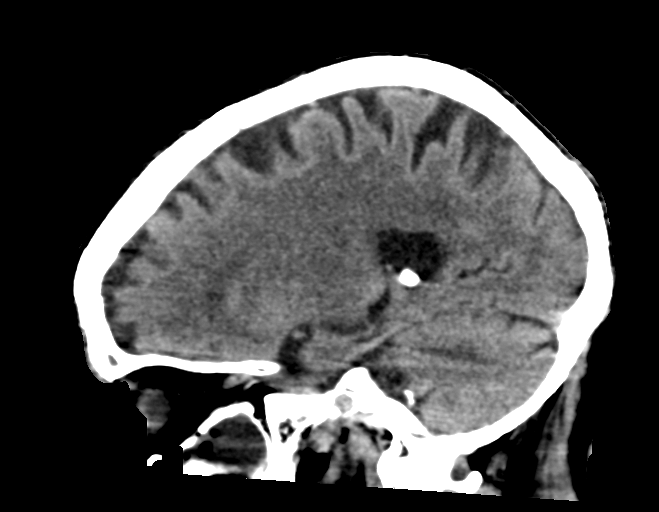

[15 of 47 positions shown; findings below may reference images not displayed]

FINDINGS: Brain: There is no evidence of acute infarct, intracranial
hemorrhage, mass, midline shift, or extra-axial fluid collection.
Mild age related cerebral atrophy is unchanged. Periventricular
white matter hypodensities are similar to the prior study and
nonspecific but compatible with chronic small vessel ischemic
disease, mild for age.

Vascular: Calcified atherosclerosis at the skullbase. No hyperdense
vessel.

Skull: No fracture or focal osseous lesion.

Sinuses/Orbits: Near complete opacification of all paranasal sinuses
bilaterally with scattered fluid levels. Clear mastoid air cells.
Prior cataract extraction.

Other: None.
IMPRESSION: 1. No evidence of acute intracranial abnormality.
2. Extensive pansinusitis.

## 2019-04-19 ENCOUNTER — Inpatient Hospital Stay (HOSPITAL_COMMUNITY)
Admission: EM | Admit: 2019-04-19 | Discharge: 2019-04-22 | DRG: 641 | Disposition: A | Discharge status: Hospice | Attending: Family Medicine | Admitting: Family Medicine

## 2019-04-19 ENCOUNTER — Emergency Department (HOSPITAL_COMMUNITY)

## 2019-04-19 ENCOUNTER — Other Ambulatory Visit: Payer: Self-pay

## 2019-04-19 ENCOUNTER — Encounter (HOSPITAL_COMMUNITY): Payer: Self-pay | Admitting: *Deleted

## 2019-04-19 DIAGNOSIS — R569 Unspecified convulsions: Secondary | ICD-10-CM

## 2019-04-19 DIAGNOSIS — Z79899 Other long term (current) drug therapy: Secondary | ICD-10-CM

## 2019-04-19 DIAGNOSIS — R531 Weakness: Secondary | ICD-10-CM

## 2019-04-19 DIAGNOSIS — Z4802 Encounter for removal of sutures: Secondary | ICD-10-CM

## 2019-04-19 DIAGNOSIS — Z79890 Hormone replacement therapy (postmenopausal): Secondary | ICD-10-CM

## 2019-04-19 DIAGNOSIS — R52 Pain, unspecified: Secondary | ICD-10-CM | POA: Diagnosis present

## 2019-04-19 DIAGNOSIS — M549 Dorsalgia, unspecified: Secondary | ICD-10-CM | POA: Diagnosis present

## 2019-04-19 DIAGNOSIS — F0390 Unspecified dementia without behavioral disturbance: Secondary | ICD-10-CM | POA: Diagnosis present

## 2019-04-19 DIAGNOSIS — Z96641 Presence of right artificial hip joint: Secondary | ICD-10-CM | POA: Diagnosis present

## 2019-04-19 DIAGNOSIS — Z515 Encounter for palliative care: Secondary | ICD-10-CM

## 2019-04-19 DIAGNOSIS — Z90710 Acquired absence of both cervix and uterus: Secondary | ICD-10-CM

## 2019-04-19 DIAGNOSIS — N39498 Other specified urinary incontinence: Secondary | ICD-10-CM | POA: Diagnosis present

## 2019-04-19 DIAGNOSIS — Z85828 Personal history of other malignant neoplasm of skin: Secondary | ICD-10-CM

## 2019-04-19 DIAGNOSIS — Z8249 Family history of ischemic heart disease and other diseases of the circulatory system: Secondary | ICD-10-CM

## 2019-04-19 DIAGNOSIS — G40909 Epilepsy, unspecified, not intractable, without status epilepticus: Secondary | ICD-10-CM | POA: Diagnosis present

## 2019-04-19 DIAGNOSIS — E039 Hypothyroidism, unspecified: Secondary | ICD-10-CM | POA: Diagnosis present

## 2019-04-19 DIAGNOSIS — W19XXXA Unspecified fall, initial encounter: Secondary | ICD-10-CM

## 2019-04-19 DIAGNOSIS — Z886 Allergy status to analgesic agent status: Secondary | ICD-10-CM

## 2019-04-19 DIAGNOSIS — Z66 Do not resuscitate: Secondary | ICD-10-CM | POA: Diagnosis present

## 2019-04-19 DIAGNOSIS — I10 Essential (primary) hypertension: Secondary | ICD-10-CM | POA: Diagnosis present

## 2019-04-19 DIAGNOSIS — G8929 Other chronic pain: Secondary | ICD-10-CM | POA: Diagnosis present

## 2019-04-19 DIAGNOSIS — R627 Adult failure to thrive: Principal | ICD-10-CM

## 2019-04-19 DIAGNOSIS — Z7952 Long term (current) use of systemic steroids: Secondary | ICD-10-CM

## 2019-04-19 DIAGNOSIS — I2510 Atherosclerotic heart disease of native coronary artery without angina pectoris: Secondary | ICD-10-CM | POA: Diagnosis present

## 2019-04-19 DIAGNOSIS — Z7189 Other specified counseling: Secondary | ICD-10-CM

## 2019-04-19 DIAGNOSIS — I739 Peripheral vascular disease, unspecified: Secondary | ICD-10-CM | POA: Diagnosis present

## 2019-04-19 DIAGNOSIS — Z7982 Long term (current) use of aspirin: Secondary | ICD-10-CM

## 2019-04-19 DIAGNOSIS — Z1159 Encounter for screening for other viral diseases: Secondary | ICD-10-CM

## 2019-04-19 DIAGNOSIS — M25552 Pain in left hip: Secondary | ICD-10-CM | POA: Diagnosis present

## 2019-04-19 LAB — SARS CORONAVIRUS 2 BY RT PCR (HOSPITAL ORDER, PERFORMED IN ~~LOC~~ HOSPITAL LAB): SARS Coronavirus 2: NEGATIVE

## 2019-04-19 MED ORDER — DONEPEZIL HCL 10 MG PO TABS
10.0000 mg | ORAL_TABLET | Freq: Every day | ORAL | Status: DC
  Filled 2019-04-19 (×3): qty 1

## 2019-04-19 MED ORDER — TOPIRAMATE 25 MG PO TABS: 100.0000 mg | ORAL_TABLET | Freq: Two times a day (BID) | ORAL | Status: DC

## 2019-04-19 MED ORDER — OXYCODONE HCL 5 MG/5ML PO SOLN: 5.0000 mg | ORAL | Status: DC | PRN

## 2019-04-19 MED ORDER — FLUDROCORTISONE ACETATE 0.1 MG PO TABS
0.1000 mg | ORAL_TABLET | Freq: Every day | ORAL | Status: DC
  Filled 2019-04-19 (×3): qty 1

## 2019-04-19 MED ORDER — POLYETHYLENE GLYCOL 3350 17 G PO PACK
17.0000 g | PACK | Freq: Every day | ORAL | Status: DC | PRN
Start: 2019-04-19 — End: 2019-04-20

## 2019-04-19 MED ORDER — HYDROCODONE-ACETAMINOPHEN 5-325 MG PO TABS
1.5000 | ORAL_TABLET | Freq: Three times a day (TID) | ORAL | Status: DC
  Administered 2019-04-20 – 2019-04-22 (×3): 1.5 via ORAL
  Filled 2019-04-19 (×3): qty 2

## 2019-04-19 MED ORDER — LEVOTHYROXINE SODIUM 50 MCG PO TABS: 75.0000 ug | ORAL_TABLET | Freq: Every day | ORAL | Status: DC

## 2019-04-19 MED ORDER — PANTOPRAZOLE SODIUM 40 MG PO TBEC: 40.0000 mg | DELAYED_RELEASE_TABLET | Freq: Every day | ORAL | Status: DC

## 2019-04-19 NOTE — ED Provider Notes (Signed)
[facility] Emergency Department Provider Note MRN:  [id number]  Arrival date & time: 04/19/19     Chief Complaint   Fall   History of Present Illness   [name] [name] is a 85 y.o. year-old female with a history of dementia, CAD presenting to the ED with chief complaint of fall.  Patient slipped off of her wheelchair today, unwitnessed.  Patient has had a significant decline in function or interest in living according to patient's son.  Had a recent hip fracture requiring surgical correction, has spent the past few weeks in a rehab or nursing facility.  Was just returned home yesterday.  Family discouraged by the great deal of care she still needs.  Patient lives with her son and son's wife, both of which have medical issues and are unable to care for her.  Patient has expressed deep concern about how she is nothing but a burden to the world at this time.  Since the surgery and since returning home, seems to have lost hope or any interest in living.  Only sleeps.  Son is also discouraged by the fact that she still has staples in her leg from her surgery 1 month ago.  States that these were supposed to be removed by the nursing home.  I was unable to obtain an accurate HPI, PMH, or ROS due to the patient's dementia.  Review of Systems  Positive for dementia, fall.  Patient's Health History    Past Medical History:  Diagnosis Date  . Cancer ([place])    skin  . Chronic back pain   . Chronic hip pain   . Chronic ulcer of left leg ([place])   . Coronary artery disease   . Dementia ([place])   . Hypertension   . Hypothyroidism   . Seizures ([place])   . Urinary incontinence due to cognitive impairment     Past Surgical History:  Procedure Laterality Date  . ABDOMINAL HYSTERECTOMY    . BACK SURGERY    . lumbar back surgery    . TOTAL HIP ARTHROPLASTY Right     Family History  Problem Relation Age of Onset  . Cancer Sister   . Hypertension Mother   . Heart attack Mother      Social History   Socioeconomic History  . Marital status: Widowed    Spouse name: Not on file  . Number of children: Not on file  . Years of education: Not on file  . Highest education level: Not on file  Occupational History  . Not on file  Social Needs  . Financial resource strain: Not on file  . Food insecurity:    Worry: Not on file    Inability: Not on file  . Transportation needs:    Medical: Not on file    Non-medical: Not on file  Tobacco Use  . Smoking status: Never Smoker  . Smokeless tobacco: Never Used  Substance and Sexual Activity  . Alcohol use: No  . Drug use: No  . Sexual activity: Not on file  Lifestyle  . Physical activity:    Days per week: Not on file    Minutes per session: Not on file  . Stress: Not on file  Relationships  . Social connections:    Talks on phone: Not on file    Gets together: Not on file    Attends religious service: Not on file    Active member of club or organization: Not on file  Attends meetings of clubs or organizations: Not on file    Relationship status: Not on file  . Intimate partner violence:    Fear of current or ex partner: Not on file    Emotionally abused: Not on file    Physically abused: Not on file    Forced sexual activity: Not on file  Other Topics Concern  . Not on file  Social History Narrative  . Not on file     Physical Exam  Vital Signs and Nursing Notes reviewed Vitals:   04/19/19 1730 04/19/19 1745  BP: (!) 141/70   Pulse: 76 73  Resp: 16 16  Temp:    SpO2: 95% 97%    CONSTITUTIONAL: Chronically ill-appearing, NAD, sleeping peacefully NEURO:  Somnolent, wake to voice, not oriented EYES:  eyes equal and reactive ENT/NECK:  no LAD, no JVD CARDIO: Regular rate, well-perfused, normal S1 and S2 PULM:  CTAB no wheezing or rhonchi GI/GU:  normal bowel sounds, non-distended, non-tender MSK/SPINE:  No gross deformities, no edema; preserved range of motion of the bilateral hips and knees,  range of motion mildly reduced due to pain. SKIN: Well-healing wounds to the left hip with 15 staples in place PSYCH:  Difficult to assess due to dementia  Diagnostic and Interventional Summary    Labs Reviewed  SARS CORONAVIRUS 2 (HOSPITAL ORDER, [facility])    No orders to display    Medications  oxyCODONE (ROXICODONE) 5 MG/5ML solution 5 mg (has no administration in time range)     .Suture Removal Date/Time: 04/19/2019 5:52 PM Performed by: Maudie Flakes, MD Authorized by: Maudie Flakes, MD   Consent:    Consent obtained:  Verbal   Consent given by:  Healthcare agent   Risks discussed:  Pain   Alternatives discussed:  No treatment Location:    Location:  Lower extremity   Lower extremity location:  Hip   Hip location:  L hip Procedure details:    Wound appearance:  No signs of infection and good wound healing   Number of staples removed:  15 Post-procedure details:    Post-removal:  No dressing applied   Patient tolerance of procedure:  Tolerated well, no immediate complications   Critical Care Critical Care Documentation Critical care time provided by me (excluding procedures): 35 minutes  Condition necessitating critical care: failure to thrive, end of life care discussion, transition to comfort care  Components of critical care management: reviewing of prior records, extended conversations with patient and patient's healthcare power of attorney, consultation with case management and palliative care.   ED Course and Medical Decision Making  I have reviewed the triage vital signs and the nursing notes.  Pertinent labs & imaging results that were available during my care of the patient were reviewed by me and considered in my medical decision making (see below for details).  After discussing options with patient's son, who is the medical decision maker for the patient, he has decided to move forward with full comfort care and  placement into a facility that provides hospice services.  We had a lengthy discussion about options, how we could continue to do diagnostic testing, but in the end he felt it was a easy decision based on the patient's wishes and how she seems ready to pass away naturally.  Will consult case management to aid with placement.  Patient does not seem like a good home hospice candidate given her level of need.  Awaiting placement, anticipating  possible overnight stay here in the ED and hopeful placement tomorrow.  Signed out to oncoming provider at end of shift.  Barth Kirks. [place] Small, [place] [email or web address]  Final Clinical Impressions(s) / ED Diagnoses     ICD-10-CM   1. Fall, initial encounter W19.XXXA   2. Fall W19.XXXA CANCELED: DG HIPS BILAT WITH PELVIS MIN 5 VIEWS    CANCELED: DG HIPS BILAT WITH PELVIS MIN 5 VIEWS  3. Failure to thrive in adult R62.7   4. End of life care Z51.5   5. Encounter for staple removal Z48.02     ED Discharge Orders    None         Maudie Flakes, MD 04/19/19 [phone]

## 2019-04-19 NOTE — TOC Initial Note (Signed)
Transition of Care [facility]) - Initial/Assessment Note    Patient Details  Name: [name] [name] MRN: [id number] Date of Birth: 1918-03-24  Transition of Care [facility]) CM/SW Contact:    Devontay Celaya Dimitri Ped, LCSW Phone Number: 04/19/2019, 11:05 PM  Clinical Narrative:   CSW in to address case management consult. CSW at bedside accompanied by pt's son at the time of assessment. Prior to being admitted into the ED, Pts son, [name] [name], states that pt was a resident at [facility] in [place]. There she spent 20 days receiving rehab for a hip fracture that occurred at home.   [name] [name] reports that pt is unable to be cared for in a home setting because himself and his wife are both disabled.[name] [name] goes into detail and explains that pt had been residing with him and his wife for the past 5 years until she recently experienced a fall in the home.   Pt was then accepted to [place] where she spent 20 days receiving PT/OT. Family explains that because she was in [place], [place], there were complications with her receiving continued treatment there despite her lack of progress due to having [place] medicaid.   Family is now seeking long term placement and hospice care for pt. Pt awaiting Palliative consult for appropriate placement suggestion.   TOC team will continue to follow pt for any discharge needs.   [facility]  Clinical Social Worker  Ph: [phone]                 Expected Discharge Plan: Long Term Nursing Home Barriers to Discharge: No Barriers Identified, Awaiting State Approval (PASRR)   Patient Goals and CMS Choice Patient states their goals for this hospitalization and ongoing recovery are:: family states the goal is Long Term Care       Expected Discharge Plan and Services Expected Discharge Plan: [place] In-house Referral: Clinical Social Work, Hospice / [place] Acute Care Choice: Nursing Home,  Hospice Living arrangements for the past 2 months: [place], [place]                                      Prior Living Arrangements/Services Living arrangements for the past 2 months: [place] Lives with:: Adult Children Patient language and need for interpreter reviewed:: Yes Do you feel safe going back to the place where you live?: No   Pt's son states that he is unable to care for Pt at home because he himself is disabled.   Need for Family Participation in Patient Care: Yes (Comment) Care giver support system in place?: Yes (comment)   Criminal Activity/Legal Involvement Pertinent to Current Situation/Hospitalization: No - Comment as needed  Activities of Daily Living      Permission Sought/Granted Permission sought to share information with : Family Supports                Emotional Assessment Appearance:: Appears stated age Attitude/Demeanor/Rapport: Unable to Assess Affect (typically observed): Unable to Assess     Psych Involvement: No (comment)  Admission diagnosis:  Fall  Patient Active Problem List   Diagnosis Date Noted  . Pansinusitis 11/27/2017  . Failure to thrive in adult 11/27/2017  . Ketonuria 11/27/2017  . Essential hypertension 11/27/2017  . Generalized weakness 11/27/2017  . Chest pain  06/01/2017  . Hypokalemia 06/01/2017  . Hyperlipidemia 06/01/2017  . Dementia without behavioral disturbance ([place]) 06/01/2017  . Dyspnea   . Elevated troponin   . UTI (urinary tract infection) 01/30/2014  . Syncope 07/01/2013  . Chronic ulcer of left leg ([place]) 07/01/2013  . Seizures ([place]) 07/01/2013  . Hypothyroidism 07/01/2013  . Eosinophilia 07/01/2013   PCP:  [facility] [place]:   [facility] [address] [place] [address] Phone: [phone] Fax: [phone]  [facility] [address], [place] [address] [place] [address] [place] [address] Phone: [phone] Fax: [phone]     Social Determinants of Health (SDOH) Interventions    Readmission Risk Interventions No flowsheet data found.

## 2019-04-19 NOTE — ED Notes (Addendum)
Spoke to Palliative Care at [facility] they state that the palliative on call for [facility] will see her in the morning.

## 2019-04-19 NOTE — ED Triage Notes (Signed)
Son brought pt in c/o that pt slid out of wheelchair this morning and hit her hips on the foot pedals. Pt reports pt just got out of 20 days of rehab yesterday and he was told that pt could stand and walk a short distance. Son reports this is not the case and they are unable to care for her at home and need nursing home placement. Son also reports that pt had right hip replacement 1 month ago and pt still has the sutures in place to right hip that the nursing home hasn't removed.

## 2019-04-19 NOTE — Progress Notes (Signed)
Consult request has been received. CSW attempting to follow up at present time  Merinda Victorino M. Markevious Ehmke LCSWA Transitions of Care  Clinical Social Worker  Ph: [phone] 

## 2019-04-20 ENCOUNTER — Encounter (HOSPITAL_COMMUNITY): Payer: Self-pay | Admitting: Primary Care

## 2019-04-20 DIAGNOSIS — Z515 Encounter for palliative care: Secondary | ICD-10-CM | POA: Diagnosis not present

## 2019-04-20 DIAGNOSIS — R627 Adult failure to thrive: Principal | ICD-10-CM

## 2019-04-20 DIAGNOSIS — Z7189 Other specified counseling: Secondary | ICD-10-CM

## 2019-04-20 DIAGNOSIS — M25552 Pain in left hip: Secondary | ICD-10-CM | POA: Diagnosis not present

## 2019-04-20 DIAGNOSIS — R52 Pain, unspecified: Secondary | ICD-10-CM | POA: Diagnosis present

## 2019-04-20 MED ORDER — CHLORPROMAZINE HCL 25 MG PO TABS: 25.0000 mg | ORAL_TABLET | Freq: Four times a day (QID) | ORAL | Status: DC | PRN

## 2019-04-20 MED ORDER — LORAZEPAM 2 MG/ML PO CONC: 1.0000 mg | ORAL | Status: DC | PRN

## 2019-04-20 MED ORDER — LORAZEPAM 2 MG/ML IJ SOLN: 1.0000 mg | INTRAMUSCULAR | Status: DC | PRN

## 2019-04-20 MED ORDER — BISACODYL 10 MG RE SUPP: 10.0000 mg | Freq: Every day | RECTAL | Status: DC | PRN

## 2019-04-20 MED ORDER — MORPHINE SULFATE (CONCENTRATE) 10 MG/0.5ML PO SOLN: 5.0000 mg | ORAL | Status: DC | PRN

## 2019-04-20 MED ORDER — ONDANSETRON HCL 4 MG/2ML IJ SOLN: 4.0000 mg | Freq: Four times a day (QID) | INTRAMUSCULAR | Status: DC | PRN

## 2019-04-20 MED ORDER — DIPHENHYDRAMINE HCL 50 MG/ML IJ SOLN: 12.5000 mg | INTRAMUSCULAR | Status: DC | PRN

## 2019-04-20 MED ORDER — MIRTAZAPINE 15 MG PO TBDP
15.0000 mg | ORAL_TABLET | Freq: Every day | ORAL | Status: DC
  Administered 2019-04-21: 15 mg via ORAL
  Filled 2019-04-20 (×5): qty 1

## 2019-04-20 MED ORDER — ATROPINE SULFATE 1 % OP SOLN: 4.0000 [drp] | OPHTHALMIC | Status: DC | PRN

## 2019-04-20 MED ORDER — TEMAZEPAM 7.5 MG PO CAPS: 7.5000 mg | ORAL_CAPSULE | Freq: Every evening | ORAL | Status: DC | PRN

## 2019-04-20 MED ORDER — POLYVINYL ALCOHOL 1.4 % OP SOLN: 1.0000 [drp] | Freq: Four times a day (QID) | OPHTHALMIC | Status: DC | PRN

## 2019-04-20 MED ORDER — RISPERIDONE 0.5 MG PO TABS
0.5000 mg | ORAL_TABLET | Freq: Two times a day (BID) | ORAL | Status: DC
  Administered 2019-04-21 – 2019-04-22 (×3): 0.5 mg via ORAL
  Filled 2019-04-20 (×4): qty 1

## 2019-04-20 MED ORDER — RISPERIDONE 0.5 MG PO TBDP
0.5000 mg | ORAL_TABLET | Freq: Two times a day (BID) | ORAL | Status: DC
  Filled 2019-04-20 (×4): qty 1

## 2019-04-20 MED ORDER — POLYVINYL ALCOHOL 1.4 % OP SOLN
2.0000 [drp] | OPHTHALMIC | Status: DC | PRN
  Filled 2019-04-20: qty 15

## 2019-04-20 MED ORDER — ACETAMINOPHEN 650 MG RE SUPP: 650.0000 mg | Freq: Four times a day (QID) | RECTAL | Status: DC | PRN

## 2019-04-20 MED ORDER — LORAZEPAM 1 MG PO TABS: 1.0000 mg | ORAL_TABLET | ORAL | Status: DC | PRN

## 2019-04-20 MED ORDER — ALBUTEROL SULFATE (2.5 MG/3ML) 0.083% IN NEBU: 2.5000 mg | INHALATION_SOLUTION | RESPIRATORY_TRACT | Status: DC | PRN

## 2019-04-20 MED ORDER — BIOTENE DRY MOUTH MT LIQD: 15.0000 mL | OROMUCOSAL | Status: DC | PRN

## 2019-04-20 MED ORDER — ACETAMINOPHEN 325 MG PO TABS: 650.0000 mg | ORAL_TABLET | Freq: Four times a day (QID) | ORAL | Status: DC | PRN

## 2019-04-20 MED ORDER — ONDANSETRON 4 MG PO TBDP: 4.0000 mg | ORAL_TABLET | Freq: Four times a day (QID) | ORAL | Status: DC | PRN

## 2019-04-20 MED ORDER — ALUM & MAG HYDROXIDE-SIMETH 200-200-20 MG/5ML PO SUSP: 30.0000 mL | Freq: Four times a day (QID) | ORAL | Status: DC | PRN

## 2019-04-20 MED ORDER — LORAZEPAM 0.5 MG PO TABS: 0.5000 mg | ORAL_TABLET | Freq: Four times a day (QID) | ORAL | Status: DC | PRN

## 2019-04-20 NOTE — ED Provider Notes (Addendum)
Pt presented to the ED yesterday from home.  Family unable to care for her.  Pt was d/w SW and with the hospice nurse practitioner.  The family decided on residential hospice.  No bed available today, so she is waiting for placement.  No oral intake for 24 hrs.  She is DNR CC.  It is unclear when residential hospice will have a bed, so I spoke with Dr. Denton Brick ([facility]) to admit for palliative and comfort care until a bed becomes available.  He will admit.   Isla Pence, MD 04/20/19 1237    Isla Pence, MD 04/20/19 1351

## 2019-04-20 NOTE — H&P (Signed)
Patient Demographics:    [name] [name], is a 85 y.o. female  MRN: [id number]   DOB - 31-Oct-1918  Admit Date - 04/19/2019  Outpatient Primary MD for the patient is [facility]   Assessment & Plan:    Principal Problem:   Hip pain, acute, left/Recent fracture and subsequent repair Active Problems:   End of life care   Seizures ([place])   Hypothyroidism   Dementia without behavioral disturbance ([place])   Failure to thrive in adult   Generalized weakness   Encounter for hospice care discussion   Pain    1)FTT/Lt Hip Pain----patient with recent left hip fracture and subsequent repair, continues with some left hip pain worsening and advancing dementia with failure to thrive, poor oral intake, overall decline, palliative care consult appreciated, patient family has agreed to place to residential hospice for comfort care pending hospice bed availability----full comfort care measures/orders initiated  2) social/ethics--- DNR/DNI, awaiting transfer to residential hospice when bed is available, overall prognosis is grave  3)H/o SZ and Dementia--- dementia has been worsening, patient is now for comfort measures  4) CAD/hypothyroidism----chest pain-free, patient is now full comfort measures   With History of - Reviewed by me  Past Medical History:  Diagnosis Date  . Cancer ([place])    skin  . Chronic back pain   . Chronic hip pain   . Chronic ulcer of left leg ([place])   . Coronary artery disease   . Dementia ([place])   . Hypertension   . Hypothyroidism   . Seizures ([place])   . Urinary incontinence due to cognitive impairment       Past Surgical History:  Procedure Laterality Date  . ABDOMINAL HYSTERECTOMY    . BACK SURGERY    . lumbar back surgery    . TOTAL HIP ARTHROPLASTY Right      Chief Complaint  Patient presents with  . Fall      HPI:    [name] [name]  is a 85 y.o. female  with a history of dementia, skin cancer, hypothyroidism, htn, seizue disorder, CAD, incontinence and  dementia.recent Lt Hip fx and subsequent repair.... Presents to ED on 04/19/2019 from  with failure to thrive, leg weakness, poor oral intake, history obtained by son due to patient's dementia.    History obtained from patient's caregivers including daughter Nevin Bloodgood and son Levi Crass...Marland KitchenMarland KitchenMarland Kitchen  Patient is unable to give history due to advanced dementia.....  EDP initially requested social work consult for possible discharge planning, palliative care also saw patient in the ED  Due to lack of availability of residential hospice bed at this time hospitalist service was called to admit patient to the hospital pending availability of hospice bed at hospice house  COVID-19 test negative on 04/19/2019    Review of systems:    In addition to the HPI above,   A full Review of  Systems was done, all other systems  reviewed are negative except as noted above in HPI , .    Social History:  Reviewed by me    Social History   Tobacco Use  . Smoking status: Never Smoker  . Smokeless tobacco: Never Used  Substance Use Topics  . Alcohol use: No     Family History :  Reviewed by me    Family History  Problem Relation Age of Onset  . Cancer Sister   . Hypertension Mother   . Heart attack Mother      Home Medications:   Prior to Admission medications   Medication Sig Start Date End Date Taking? Authorizing Provider  aspirin EC 81 MG tablet Take 81 mg by mouth daily.   Yes [provider]  Calcium Carbonate-Vitamin D (CALCIUM 600 + D PO) Take 1 tablet by mouth daily.    Yes [provider]  Cyanocobalamin (VITAMIN B-12) 1000 MCG SUBL Place 1 tablet under the tongue every morning.    Yes [provider]  donepezil (ARICEPT) 10 MG tablet Take 10 mg by  mouth at bedtime.   Yes [provider]  fludrocortisone (FLORINEF) 0.1 MG tablet Take 0.1 mg by mouth daily.   Yes [provider]  HYDROcodone-acetaminophen (NORCO) 7.5-325 MG tablet Take 1 tablet by mouth 3 (three) times daily.  05/04/15  Yes [provider]  levocetirizine (XYZAL) 2.5 MG/5ML solution Take 5 mLs by mouth every evening.  10/23/17  Yes [provider]  levothyroxine (SYNTHROID, LEVOTHROID) 75 MCG tablet Take 75 mcg by mouth daily before breakfast.   Yes [provider]  omeprazole (PRILOSEC) 20 MG capsule Take 20 mg by mouth every morning.    Yes [provider]  polyethylene glycol (MIRALAX / GLYCOLAX) packet Take 17 g by mouth daily as needed for mild constipation. 11/30/17  Yes Johnson, Clanford L, MD  simvastatin (ZOCOR) 20 MG tablet Take 20 mg by mouth at bedtime.   Yes [provider]  topiramate (TOPAMAX) 100 MG tablet Take 1 tablet (100 mg total) by mouth 2 (two) times daily. 06/03/15  Yes Nat Christen, MD  vitamin C (ASCORBIC ACID) 500 MG tablet Take 500 mg by mouth every morning.    Yes [provider]     Allergies:     Allergies  Allergen Reactions  . Feldene [Piroxicam] Rash    Pt denies allergy to this medication     Physical Exam:   Vitals  Blood pressure 110/61, pulse 77, temperature 97.7 F (36.5 C), temperature source Temporal, resp. rate 16, height 5\' 1"  (1.549 m), weight 54.4 kg, SpO2 98 %.  Physical Examination: General appearance - alert,  and in no distress Mental status -significant cognitive deficits, disoriented  eyes - sclera anicteric Neck - supple, no JVD elevation , Chest - clear  to auscultation bilaterally, symmetrical air movement,  Heart - S1 and S2 normal, regular  Abdomen - soft, nontender, nondistended, no masses or organomegaly Neurological - neck supple without rigidity,DTR's normal and symmetric... Generalized weakness without new focal deficits Extremities  - no pedal edema noted, intact peripheral pulses , healed left hip post-op wound Skin - warm, dry    Data Review:    CBC No results for input(s): WBC, HGB, HCT, PLT, MCV, MCH, MCHC, RDW, LYMPHSABS, MONOABS, EOSABS, BASOSABS, BANDABS in the last 168 hours.  Invalid input(s): NEUTRABS, BANDSABD ------------------------------------------------------------------------------------------------------------------  Chemistries  No results for input(s): NA, K, CL, CO2, GLUCOSE, BUN, CREATININE, CALCIUM, MG, AST, ALT, ALKPHOS, BILITOT in the  last 168 hours.  Invalid input(s): GFRCGP ------------------------------------------------------------------------------------------------------------------ CrCl cannot be calculated (Patient's most recent lab result is older than the maximum 21 days allowed.). ------------------------------------------------------------------------------------------------------------------ No results for input(s): TSH, T4TOTAL, T3FREE, THYROIDAB in the last 72 hours.  Invalid input(s): FREET3   Coagulation profile No results for input(s): INR, PROTIME in the last 168 hours. ------------------------------------------------------------------------------------------------------------------- No results for input(s): DDIMER in the last 72 hours. -------------------------------------------------------------------------------------------------------------------  Cardiac Enzymes No results for input(s): CKMB, TROPONINI, MYOGLOBIN in the last 168 hours.  Invalid input(s): CK ------------------------------------------------------------------------------------------------------------------    Component Value Date/Time   BNP 228.0 (H) 11/27/2017 1800     ---------------------------------------------------------------------------------------------------------------  Urinalysis    Component Value Date/Time   COLORURINE YELLOW 11/27/2017 1930   APPEARANCEUR CLEAR 11/27/2017  1930   LABSPEC 1.020 11/27/2017 1930   PHURINE 6.0 11/27/2017 1930   GLUCOSEU NEGATIVE 11/27/2017 1930   HGBUR NEGATIVE 11/27/2017 1930   BILIRUBINUR NEGATIVE 11/27/2017 1930   KETONESUR 80 (A) 11/27/2017 1930   PROTEINUR NEGATIVE 11/27/2017 1930   UROBILINOGEN 0.2 06/03/2015 1415   NITRITE NEGATIVE 11/27/2017 1930   LEUKOCYTESUR NEGATIVE 11/27/2017 1930   ----------------------------------------------------------------------------------------------------------------   Imaging Results:    No results found.  Radiological Exams on Admission: No results found.  DVT Prophylaxis -comfort measures AM Labs Ordered, also please review Full Orders  Family Communication: Admission, patients condition and plan of care including tests being ordered have been discussed with the patient and  daughter Nevin Bloodgood and son Leathia Farnell...Marland KitchenMarland KitchenMarland Kitchenwho indicate understanding and agree with the plan   Code Status - Full Code  Likely DC to  home  Condition--grave prognosis    Roxan Hockey M.D on 04/20/2019 at 2:45 PM Go to [email or web address] -  for contact info  [facility] - Office  [phone]

## 2019-04-20 NOTE — ED Notes (Signed)
Pt was offered some food for breakfast. Pt states she did not want any.

## 2019-04-20 NOTE — Clinical Social Work Note (Signed)
Patient Information   Patient Name Myrtie, Leuthold ([id number]) Sex Female DOB [birth year]   SS# [id number]  Room Bed  APA01 APA01  Patient Demographics   Address [place] [address] Phone [phone] (Home)  Patient Ethnicity & Race   Ethnic Group Patient Race  Not Hispanic or Latino White or Caucasian  Emergency Contact(s)   Name Relation Home Work [place] Son [phone]    [facility] Daughter [phone]    Documents on File    Status Date Received Description  Documents for the Patient  [place] Not Received    [facility] E-Signature HIPAA Notice of Privacy Received 46/27/03   Driver's License Not Received    Insurance Card Not Received    Advance Directives/Living Will/HCPOA/POA Not Received    Insurance Card Not Received    Insurance Card  01/29/14   HIM ROI Authorization (Expired) 02/07/14 Patient is here now. Please send her latest records - EKG, consult, discharge summary, echo. Admitted 01/29/2014 Discharged 01/30/2014.  Release of Information  02/08/14   HIM ROI Authorization  03/15/14   Release of Information  03/16/14   HIM ROI Authorization  04/24/14   Release of Information  04/25/14   Other Photo ID Not Received    [place] E-Signature HIPAA Notice of Privacy Signed 05/31/17   Insurance Card Received 11/27/17 new [facility] card  [place] E-Signature HIPAA Notice of Privacy Signed 11/27/17   Patient Photo   Photo of Patient  Documents for the Encounter  AOB (Assignment of Insurance Benefits) Received 04/19/19   E-signature AOB     MEDICARE RIGHTS Received 04/19/19   E-signature Medicare Rights     ED Patient Billing Extract   ED PB Billing Extract  Admission Information   Current Information   Attending Provider Admitting Provider Admission Type Admission Status  Default, Provider, MD  Emergency Admission (Confirmed)       Admission Date/Time Discharge Date  Hospital Service Auth/Cert Status  50/09/38 05:12 PM  Emergency Medicine Incomplete       Hospital Area Unit Room/Bed   [place] DEPT APA01/APA01        Admission   Complaint  [facility] Account   Name Acct ID Class Status Primary Coverage  Nhi, [place] [id number] Emergency Open MEDICARE - MEDICARE PART A AND B      Guarantor Account (for Hospital Account [id number])   Name Relation to Pt Service Area Active? Acct Type  [place] Self CHSA Yes Personal/Family  Address Phone    [address] [place], [place] [address] [phone])        Coverage Information (for Hospital Account [id number])   1. [place] PART A AND B   F/O Payor/Plan Precert #  MEDICARE/MEDICARE PART A AND B   Subscriber Subscriber #  Baeleigh, Devincent [id number]  Address Phone  [address] [place], [place] [address]   2. MEDICAID [place]/MEDICAID [place] ACCESS   F/O Payor/Plan Precert #  MEDICAID [place]/MEDICAID [place] ACCESS   Subscriber Subscriber #  [place], [place] [id number]  Address Phone  [address] [place], [place] [address]

## 2019-04-20 NOTE — Progress Notes (Signed)
Bladder scanned patient per order. Bladder scan shows 285 urine retention. MD has been notified.

## 2019-04-20 NOTE — Consult Note (Signed)
Consultation Note Date: 04/20/2019   Patient Name: [name] [name]  DOB: 1918-06-23  MRN: [id number]  Age / Sex: 85 y.o., female  PCP: [facility] Referring Physician: Default, Provider, MD  Reason for Consultation: Establishing goals of care, Inpatient hospice referral and Psychosocial/spiritual support  HPI/Patient Profile: 85 y.o. female  with past medical history of dementia, recent hip fracture with repair, CAD, PVD, chronic ulcer of left leg, hypertension, seizure disorder, hypothyroidism, chronic hip and back pain, history of skin cancer admitted on 04/19/2019 with failure to thrive and dementia after hip fracture and repair.   Clinical Assessment and Goals of Care: [name] is lying quietly in bed.  She will only briefly open her eyes when I request but does not make eye contact.  She appears acutely/chronically ill and very frail.  She has recently had a hip fracture with repair and has a market decline since.  She has a history of dementia also.  She does not interact with me in any meaningful way and declines food or drink.  Thankfully, she denies pain.  Meeting with family and social worker in the family waiting area.  We talked about [name]'s acute and chronic health problems, and also her decline over the last year.  Son Laverna Peace shares that she has lived with him and his wife over the last 5 years.  He states that specifically over the last 1 year she spends most of her time sleeping.  We talked about healthcare power of attorney, see below.  We talked about CODE STATUS, see below.  We talked about hospice care, what is and is not provided.  We talked about un burdening [name] from medications and treatments that are not changing what is happening.  Son Laverna Peace states that he sees her suffering and does not want to prolong her dying process.  I share that residential  hospice is a kind and loving choice, reassuring family that Mrs. Bickle will continue to be cared for.   Palliative performance scale PPS 1 year ago:60% PPS 6 months: 50% PPS 1 month ago:30% PPS now 20%   HC POA HCPOA -son Adiel Mcnamara is designated healthcare power of attorney.  Daughter, Peter Congo is also at bedside and shares in decision to accept residential hospice.  Another daughter also in [place] and 2 deceased siblings.   SUMMARY OF RECOMMENDATIONS   Family is requesting comfort and dignity at end-of-life, residential hospice. Facility to be decided, social work Sports administrator.  Code Status/Advance Care Planning:  DNR -allowing natural death  Symptom Management:   Pain and anxiety meds adjusted  Full comfort care  Palliative Prophylaxis:   Frequent Pain Assessment and Turn Reposition  Additional Recommendations (Limitations, Scope, Preferences):  Full Comfort Care  Psycho-social/Spiritual:   Desire for further Chaplaincy support:no  Additional Recommendations: Caregiving  Support/Resources and Education on Hospice  Prognosis:   < 2 weeks expected based on market functional decline over the last 1 month, frailty, poor by mouth intake, family's desire to focus on  comfort and dignity, let nature take its course.  Discharge Planning: Family is requesting comfort and dignity at end-of-life, residential hospice.      Primary Diagnoses: Present on Admission: **None**   I have reviewed the medical record, interviewed the patient and family, and examined the patient. The following aspects are pertinent.  Past Medical History:  Diagnosis Date   Cancer ([place])    skin   Chronic back pain    Chronic hip pain    Chronic ulcer of left leg (HCC)    Coronary artery disease    Dementia (HCC)    Hypertension    Hypothyroidism    Seizures (HCC)    Urinary incontinence due to cognitive impairment    Social History   Socioeconomic History   Marital  status: Widowed    Spouse name: Not on file   Number of children: Not on file   Years of education: Not on file   Highest education level: Not on file  Occupational History   Not on file  Social Needs   Financial resource strain: Not on file   Food insecurity:    Worry: Not on file    Inability: Not on file   Transportation needs:    Medical: Not on file    Non-medical: Not on file  Tobacco Use   Smoking status: Never Smoker   Smokeless tobacco: Never Used  Substance and Sexual Activity   Alcohol use: No   Drug use: No   Sexual activity: Not on file  Lifestyle   Physical activity:    Days per week: Not on file    Minutes per session: Not on file   Stress: Not on file  Relationships   Social connections:    Talks on phone: Not on file    Gets together: Not on file    Attends religious service: Not on file    Active member of club or organization: Not on file    Attends meetings of clubs or organizations: Not on file    Relationship status: Not on file  Other Topics Concern   Not on file  Social History Narrative   Not on file   Family History  Problem Relation Age of Onset   Cancer Sister    Hypertension Mother    Heart attack Mother    Scheduled Meds:  donepezil  10 mg Oral QHS   fludrocortisone  0.1 mg Oral Daily   HYDROcodone-acetaminophen  1.5 tablet Oral TID   levothyroxine  75 mcg Oral QAC breakfast   pantoprazole  40 mg Oral Daily   topiramate  100 mg Oral BID   Continuous Infusions: PRN Meds:.oxyCODONE, polyethylene glycol Medications Prior to Admission:  Prior to Admission medications   Medication Sig Start Date End Date Taking? Authorizing Provider  aspirin EC 81 MG tablet Take 81 mg by mouth daily.   Yes [provider]  Calcium Carbonate-Vitamin D (CALCIUM 600 + D PO) Take 1 tablet by mouth daily.    Yes [provider]  Cyanocobalamin (VITAMIN B-12) 1000 MCG SUBL Place 1 tablet under the tongue every  morning.    Yes [provider]  donepezil (ARICEPT) 10 MG tablet Take 10 mg by mouth at bedtime.   Yes [provider]  fludrocortisone (FLORINEF) 0.1 MG tablet Take 0.1 mg by mouth daily.   Yes [provider]  HYDROcodone-acetaminophen (NORCO) 7.5-325 MG tablet Take 1 tablet by mouth 3 (three) times daily.  05/04/15  Yes [provider]  levocetirizine (XYZAL) 2.5 MG/5ML solution Take 5 mLs by mouth every evening.  10/23/17  Yes [provider]  levothyroxine (SYNTHROID, LEVOTHROID) 75 MCG tablet Take 75 mcg by mouth daily before breakfast.   Yes [provider]  omeprazole (PRILOSEC) 20 MG capsule Take 20 mg by mouth every morning.    Yes [provider]  polyethylene glycol (MIRALAX / GLYCOLAX) packet Take 17 g by mouth daily as needed for mild constipation. 11/30/17  Yes Johnson, Clanford L, MD  simvastatin (ZOCOR) 20 MG tablet Take 20 mg by mouth at bedtime.   Yes [provider]  topiramate (TOPAMAX) 100 MG tablet Take 1 tablet (100 mg total) by mouth 2 (two) times daily. 06/03/15  Yes Nat Christen, MD  vitamin C (ASCORBIC ACID) 500 MG tablet Take 500 mg by mouth every morning.    Yes [provider]   Allergies  Allergen Reactions   Feldene [Piroxicam] Rash    Pt denies allergy to this medication   Review of Systems  Unable to perform ROS: Dementia    Physical Exam Vitals signs and nursing note reviewed.  Constitutional:      Comments: Appears acutely/chronically ill and frail.  Will only briefly open her eyes.  Does not hold head off the pillow  HENT:     Head: Atraumatic.  Cardiovascular:     Rate and Rhythm: Normal rate.  Pulmonary:     Effort: Pulmonary effort is normal. No respiratory distress.  Abdominal:     General: Abdomen is flat. There is no distension.  Musculoskeletal:        General: No swelling.  Skin:    General: Skin is warm and dry.  Neurological:     Comments: Will only tell  me her name after multiple requests, does not try to interact.  Psychiatric:     Comments: Calm, not fearful     Vital Signs: BP 125/60    Pulse 84    Temp 97.7 F (36.5 C) (Temporal)    Resp 14    Ht 5\' 1"  (1.549 m)    Wt 54.4 kg    SpO2 96%    BMI 22.67 kg/m  Pain Scale: PAINAD   Pain Score: Asleep   SpO2: SpO2: 96 % O2 Device:SpO2: 96 % O2 Flow Rate: .   IO: Intake/output summary: No intake or output data in the 24 hours ending 04/20/19 1140  LBM:   Baseline Weight: Weight: 54.4 kg Most recent weight: Weight: 54.4 kg     Palliative Assessment/Data:   Flowsheet Rows     Most Recent Value  Intake Tab  Referral Department  Critical care  Unit at Time of Referral  ER  Palliative Care Primary Diagnosis  Neurology  Date Notified  04/20/19  Palliative Care Type  New Palliative care  Reason for referral  Clarify Goals of Care, Counsel Regarding Hospice  Date of Admission  04/20/19  Date first seen by Palliative Care  04/20/19  # of days Palliative referral response time  0 Day(s)  # of days IP prior to Palliative referral  0  Clinical Assessment  Palliative Performance Scale Score  20%  Pain Max last 24 hours  Not able to report  Pain Min Last 24 hours  Not able to report  Dyspnea Max Last 24 Hours  Not able to report  Dyspnea Min Last 24 hours  Not able to report  Psychosocial & Spiritual Assessment  Palliative Care Outcomes  Time In: 0840 Time Out: 1000 Time Total: 80 minutes Greater than 50%  of this time was spent counseling and coordinating care related to the above assessment and plan.  Signed by: Drue Novel, NP   Please contact Palliative Medicine Team phone at [phone] for questions and concerns.  For individual provider: See Shea Evans

## 2019-04-20 NOTE — Clinical Social Work Note (Signed)
Spent time with Laverna Peace and Peter Congo, son and daughter, as they went through the process of making a decision about which direction to go with their mother.  Was joined [employer], Palliative NP, who continued process with [place].  Family ultimately made decision to refer to [facility].  They were wondering about hospice home in [place], but none located.  Will refer to [facility]    After much discussion back and forth, it became clear that since a bed is not currently available at [facility], pt will need to be visited here by hospice nurse and admitted GIP in order for patient to be formally accepted and have access to bed when available.  Thank you Tiffany, [facility], for helping with that process.  Nurse will come this afternoon.  Family, who are still at bedside, will await arrival of hospice nurse to answer further questions and sign paperwork.

## 2019-04-20 NOTE — ED Notes (Signed)
Hospice arrived to speak to patient and family.

## 2019-04-20 NOTE — ED Notes (Signed)
Pt refused breakfast meal tray.

## 2019-04-21 DIAGNOSIS — E039 Hypothyroidism, unspecified: Secondary | ICD-10-CM | POA: Diagnosis present

## 2019-04-21 DIAGNOSIS — N39498 Other specified urinary incontinence: Secondary | ICD-10-CM | POA: Diagnosis present

## 2019-04-21 DIAGNOSIS — I739 Peripheral vascular disease, unspecified: Secondary | ICD-10-CM | POA: Diagnosis present

## 2019-04-21 DIAGNOSIS — Z886 Allergy status to analgesic agent status: Secondary | ICD-10-CM | POA: Diagnosis not present

## 2019-04-21 DIAGNOSIS — Z515 Encounter for palliative care: Secondary | ICD-10-CM | POA: Diagnosis not present

## 2019-04-21 DIAGNOSIS — G40909 Epilepsy, unspecified, not intractable, without status epilepticus: Secondary | ICD-10-CM | POA: Diagnosis present

## 2019-04-21 DIAGNOSIS — Z90710 Acquired absence of both cervix and uterus: Secondary | ICD-10-CM | POA: Diagnosis not present

## 2019-04-21 DIAGNOSIS — Z79899 Other long term (current) drug therapy: Secondary | ICD-10-CM | POA: Diagnosis not present

## 2019-04-21 DIAGNOSIS — Z7982 Long term (current) use of aspirin: Secondary | ICD-10-CM | POA: Diagnosis not present

## 2019-04-21 DIAGNOSIS — I10 Essential (primary) hypertension: Secondary | ICD-10-CM | POA: Diagnosis present

## 2019-04-21 DIAGNOSIS — M549 Dorsalgia, unspecified: Secondary | ICD-10-CM | POA: Diagnosis present

## 2019-04-21 DIAGNOSIS — G8929 Other chronic pain: Secondary | ICD-10-CM | POA: Diagnosis present

## 2019-04-21 DIAGNOSIS — Z66 Do not resuscitate: Secondary | ICD-10-CM | POA: Diagnosis present

## 2019-04-21 DIAGNOSIS — F0390 Unspecified dementia without behavioral disturbance: Secondary | ICD-10-CM | POA: Diagnosis present

## 2019-04-21 DIAGNOSIS — Z85828 Personal history of other malignant neoplasm of skin: Secondary | ICD-10-CM | POA: Diagnosis not present

## 2019-04-21 DIAGNOSIS — I2510 Atherosclerotic heart disease of native coronary artery without angina pectoris: Secondary | ICD-10-CM | POA: Diagnosis present

## 2019-04-21 DIAGNOSIS — Z1159 Encounter for screening for other viral diseases: Secondary | ICD-10-CM | POA: Diagnosis not present

## 2019-04-21 DIAGNOSIS — M25552 Pain in left hip: Secondary | ICD-10-CM | POA: Diagnosis present

## 2019-04-21 DIAGNOSIS — Z8249 Family history of ischemic heart disease and other diseases of the circulatory system: Secondary | ICD-10-CM | POA: Diagnosis not present

## 2019-04-21 DIAGNOSIS — Z7952 Long term (current) use of systemic steroids: Secondary | ICD-10-CM | POA: Diagnosis not present

## 2019-04-21 DIAGNOSIS — Z79890 Hormone replacement therapy (postmenopausal): Secondary | ICD-10-CM | POA: Diagnosis not present

## 2019-04-21 DIAGNOSIS — Z96641 Presence of right artificial hip joint: Secondary | ICD-10-CM | POA: Diagnosis present

## 2019-04-21 DIAGNOSIS — R627 Adult failure to thrive: Secondary | ICD-10-CM | POA: Diagnosis not present

## 2019-04-21 NOTE — TOC Progression Note (Signed)
Transition of Care [facility]) - Progression Note    Patient Details  Name: Arbor Leer MRN: [id number] Date of Birth: January 25, 1918  [facility] CM/SW Contact  Roda Shutters Margretta Sidle, RN Phone Number: 04/21/2019, 3:42 PM  Clinical Narrative:   CM called [facility] for follow up. They do not have a bed available at this time. TOC will cont to follow and communicate with hospice facility.    Expected Discharge Plan: [place] Barriers to Discharge: Hospice Bed not available  Expected Discharge Plan and Services Expected Discharge Plan: [place] In-house Referral: Clinical Social Work, Hospice / [place] Acute Care Choice: Nursing Home, Hospice Living arrangements for the past 2 months: [place], [place]

## 2019-04-21 NOTE — Progress Notes (Signed)
Patient Demographics:    [name] [name], is a 85 y.o. female, DOB - 1918-09-26, YIA:[id number]  Admit date - 04/19/2019   Admitting Physician Ejiroghene Arlyce Dice, MD  Outpatient Primary MD for the patient is [facility]  LOS - 0   Chief Complaint  Patient presents with  . Fall        Subjective:    [name] [name] today has no fevers, no emesis,  No chest pain, oral intake is poor.... Resting off and on  Assessment  & Plan :    Principal Problem:   Failure to thrive in adult Active Problems:   End of life care   Seizures (HCC)   Hypothyroidism   Dementia without behavioral disturbance (HCC)   Generalized weakness   Encounter for hospice care discussion   Pain   Hip pain, acute, left/Recent fracture and subsequent repair   Brief summary 85 y.o. female  with a history of dementia, skin cancer, hypothyroidism, htn, seizue disorder, CAD, incontinence and  dementia.recent Lt Hip fx and subsequent repair admitted on 04/19/2019 from  with failure to thrive, leg weakness, poor oral intake, history obtained by son and daughter due to patient's advanced dementia.    A/p 1)FTT/Lt Hip Pain----patient with recent left hip fracture and subsequent repair, continues with some left hip pain worsening and advancing dementia with failure to thrive, poor oral intake, overall decline, palliative care consult appreciated, patient family has agreed to place to residential hospice for comfort care pending hospice bed availability----full comfort care measures/orders initiated  2) social/ethics--- DNR/DNI, awaiting transfer to residential hospice when bed is available, overall prognosis is grave  3)H/o SZ and Dementia--- dementia has been worsening, patient is now for comfort measures  4) CAD/hypothyroidism----chest pain-free, patient is now full comfort measures  Disposition/Need for  in-Hospital Stay- patient unable to be discharged at this time due to awaiting transfer to residential hospice when bed becomes available  Code Status : DNR  Family Communication:   Son and daughter   Disposition Plan  : Hospice House  Consults  :  Hospice/Palliative  DVT Prophylaxis  : Hospice  Lab Results  Component Value Date   PLT 317 11/29/2017    Inpatient Medications  Scheduled Meds: . HYDROcodone-acetaminophen  1.5 tablet Oral TID  . mirtazapine  15 mg Oral QHS  . risperiDONE  0.5 mg Oral BID   Continuous Infusions: PRN Meds:.acetaminophen **OR** acetaminophen, albuterol, alum & mag hydroxide-simeth, antiseptic oral rinse, atropine, bisacodyl, chlorproMAZINE, diphenhydrAMINE, LORazepam **OR** LORazepam **OR** LORazepam, LORazepam, LORazepam, morphine CONCENTRATE **OR** morphine CONCENTRATE, ondansetron **OR** ondansetron (ZOFRAN) IV, oxyCODONE, polyvinyl alcohol, polyvinyl alcohol, temazepam    Anti-infectives (From admission, onward)   None        Objective:   Vitals:   04/20/19 1942 04/20/19 2257 04/21/19 0625 04/21/19 0811  BP:  (!) 148/79 134/66   Pulse:  71 72   Resp:  16 20   Temp:  97.9 F (36.6 C) 98.2 F (36.8 C)   TempSrc:  Oral Oral   SpO2: 96% 98% 98% 97%  Weight:      Height:        Wt Readings from Last 3 Encounters:  04/19/19 54.4 kg  11/27/17 54 kg  11/23/17  59.4 kg     Intake/Output Summary (Last 24 hours) at 04/21/2019 1410 Last data filed at 04/21/2019 1100 Gross per 24 hour  Intake 30 ml  Output 25 ml  Net 5 ml     Physical Exam Patient is examined daily including today on 04/21/19 , exams remain the same as of yesterday except that has changed   Gen:- Awake , in no acute distress HEENT:- [place].AT, No sclera icterus Neck-Supple Neck,No JVD,.  Lungs-  CTAB , fair symmetrical air movement CV- S1, S2 normal, regular  Abd-  +ve B.Sounds, Abd Soft, No tenderness,    Extremity/Skin:- No  edema, pedal pulses present   Psych-significant cognitive deficits, very advanced dementia  neuro-generalized weakness    Data Review:   Micro Results Recent Results (from the past 240 hour(s))  SARS Coronavirus 2 (CEPHEID - Performed in [place] hospital lab), Hosp Order     Status: None   Collection Time: 04/19/19  6:40 PM  Result Value Ref Range Status   SARS Coronavirus 2 NEGATIVE NEGATIVE Final    Comment: (NOTE) If result is NEGATIVE SARS-CoV-2 target nucleic acids are NOT DETECTED. The SARS-CoV-2 RNA is generally detectable in upper and lower  respiratory specimens during the acute phase of infection. The lowest  concentration of SARS-CoV-2 viral copies this assay can detect is 250  copies / mL. A negative result does not preclude SARS-CoV-2 infection  and should not be used as the sole basis for treatment or other  patient management decisions.  A negative result may occur with  improper specimen collection / handling, submission of specimen other  than nasopharyngeal swab, presence of viral mutation(s) within the  areas targeted by this assay, and inadequate number of viral copies  (<250 copies / mL). A negative result must be combined with clinical  observations, patient history, and epidemiological information. If result is POSITIVE SARS-CoV-2 target nucleic acids are DETECTED. The SARS-CoV-2 RNA is generally detectable in upper and lower  respiratory specimens dur ing the acute phase of infection.  Positive  results are indicative of active infection with SARS-CoV-2.  Clinical  correlation with patient history and other diagnostic information is  necessary to determine patient infection status.  Positive results do  not rule out bacterial infection or co-infection with other viruses. If result is PRESUMPTIVE POSTIVE SARS-CoV-2 nucleic acids MAY BE PRESENT.   A presumptive positive result was obtained on the submitted specimen  and confirmed on repeat testing.  While 2019 novel coronavirus   (SARS-CoV-2) nucleic acids may be present in the submitted sample  additional confirmatory testing may be necessary for epidemiological  and / or clinical management purposes  to differentiate between  SARS-CoV-2 and other Sarbecovirus currently known to infect humans.  If clinically indicated additional testing with an alternate test  methodology [phone]) is advised. The SARS-CoV-2 RNA is generally  detectable in upper and lower respiratory sp ecimens during the acute  phase of infection. The expected result is Negative. Fact Sheet for Patients:  StrictlyIdeas.no Fact Sheet for Healthcare Providers: BankingDealers.co.za This test is not yet approved or cleared by the [place] FDA and has been authorized for detection and/or diagnosis of SARS-CoV-2 by FDA under an Emergency Use Authorization (EUA).  This EUA will remain in effect (meaning this test can be used) for the duration of the COVID-19 declaration under Section 564(b)(1) of the Act, 21 U.S.C. section 360bbb-3(b)(1), unless the authorization is terminated or revoked sooner. Performed at [facility], [address]  [place], [place] [address]     Radiology Reports No results found.   CBC No results for input(s): WBC, HGB, HCT, PLT, MCV, MCH, MCHC, RDW, LYMPHSABS, MONOABS, EOSABS, BASOSABS, BANDABS in the last 168 hours.  Invalid input(s): NEUTRABS, BANDSABD  Chemistries  No results for input(s): NA, K, CL, CO2, GLUCOSE, BUN, CREATININE, CALCIUM, MG, AST, ALT, ALKPHOS, BILITOT in the last 168 hours.  Invalid input(s): GFRCGP ------------------------------------------------------------------------------------------------------------------ No results for input(s): CHOL, HDL, LDLCALC, TRIG, CHOLHDL, LDLDIRECT in the last 72 hours.  Lab Results  Component Value Date   HGBA1C 5.6 06/01/2017    ------------------------------------------------------------------------------------------------------------------ No results for input(s): TSH, T4TOTAL, T3FREE, THYROIDAB in the last 72 hours.  Invalid input(s): FREET3 ------------------------------------------------------------------------------------------------------------------ No results for input(s): VITAMINB12, FOLATE, FERRITIN, TIBC, IRON, RETICCTPCT in the last 72 hours.  Coagulation profile No results for input(s): INR, PROTIME in the last 168 hours.  No results for input(s): DDIMER in the last 72 hours.  Cardiac Enzymes No results for input(s): CKMB, TROPONINI, MYOGLOBIN in the last 168 hours.  Invalid input(s): CK ------------------------------------------------------------------------------------------------------------------    Component Value Date/Time   BNP 228.0 (H) 11/27/2017 1800     Roxan Hockey M.D on 04/21/2019 at 2:10 PM  Go to [email or web address] - for contact info  [facility] - Office  [phone]

## 2019-04-21 NOTE — Progress Notes (Signed)
Palliative: [name] [name] is lying quietly in bed.  She will open her eyes when I ask, but not make eye contact.  She appears acutely/chronically ill and very frail.  She is able to take some apple juice without overt signs and symptoms of aspiration.  She denies pain, and looks relatively comfortable.  No family at bedside at this time due to visitor restrictions.  Conference with social worker/case management related to hospice referral, looking to GIP status.   43 minutes [name] Axe, NP Palliative Medicine Team Team Phone # [phone] Greater than 50% of this time was spent counseling and coordinating care related to the above assessment and plan.

## 2019-04-22 MED ORDER — MIRTAZAPINE 15 MG PO TBDP: 15.0000 mg | ORAL_TABLET | Freq: Every day | ORAL | 1 refills | Status: AC

## 2019-04-22 MED ORDER — OXYCODONE HCL 5 MG/5ML PO SOLN: 5.0000 mg | ORAL | 0 refills | Status: AC | PRN

## 2019-04-22 MED ORDER — LORAZEPAM 0.5 MG PO TABS: 0.5000 mg | ORAL_TABLET | Freq: Four times a day (QID) | ORAL | 0 refills | Status: AC | PRN

## 2019-04-22 MED ORDER — POLYVINYL ALCOHOL 1.4 % OP SOLN: 1.0000 [drp] | Freq: Four times a day (QID) | OPHTHALMIC | 0 refills | Status: AC | PRN

## 2019-04-22 MED ORDER — CHLORPROMAZINE HCL 25 MG PO TABS: 25.0000 mg | ORAL_TABLET | Freq: Four times a day (QID) | ORAL | 1 refills | Status: AC | PRN

## 2019-04-22 MED ORDER — MORPHINE SULFATE (CONCENTRATE) 10 MG/0.5ML PO SOLN: 5.0000 mg | ORAL | 0 refills | Status: AC | PRN

## 2019-04-22 MED ORDER — ACETAMINOPHEN 325 MG PO TABS: 650.0000 mg | ORAL_TABLET | Freq: Four times a day (QID) | ORAL | 1 refills | Status: AC | PRN

## 2019-04-22 MED ORDER — ACETAMINOPHEN 650 MG RE SUPP: 650.0000 mg | Freq: Four times a day (QID) | RECTAL | 0 refills | Status: AC | PRN

## 2019-04-22 MED ORDER — LORAZEPAM 2 MG/ML PO CONC: 1.0000 mg | ORAL | 0 refills | Status: AC | PRN

## 2019-04-22 MED ORDER — POLYVINYL ALCOHOL 1.4 % OP SOLN: 2.0000 [drp] | OPHTHALMIC | 0 refills | Status: AC | PRN

## 2019-04-22 MED ORDER — BIOTENE DRY MOUTH MT LIQD: 15.0000 mL | OROMUCOSAL | 0 refills | Status: AC | PRN

## 2019-04-22 NOTE — Discharge Summary (Signed)
[name] [name], is a 85 y.o. female  DOB 06/01/1918  MRN [id number].  Admission date:  04/19/2019  Admitting Physician  Bethena Roys, MD  Discharge Date:  04/22/2019   Primary MD  [facility]  Recommendations for primary care physician for things to follow:   Transfer to residential hospice with comfort measures  Admission Diagnosis  End of life care [Z51.5] Fall [W19.XXXA] Failure to thrive in adult [R62.7] Encounter for staple removal [Z48.02] Fall, initial encounter [W19.XXXA]   Discharge Diagnosis  End of life care [Z51.5] Fall [W19.XXXA] Failure to thrive in adult [R62.7] Encounter for staple removal [Z48.02] Fall, initial encounter [W19.XXXA]    Principal Problem:   Failure to thrive in adult Active Problems:   End of life care   Seizures ([place])   Hypothyroidism   Dementia without behavioral disturbance (HCC)   Generalized weakness   Encounter for hospice care discussion   Pain   Hip pain, acute, left/Recent fracture and subsequent repair      Past Medical History:  Diagnosis Date  . Cancer ([place])    skin  . Chronic back pain   . Chronic hip pain   . Chronic ulcer of left leg ([place])   . Coronary artery disease   . Dementia ([place])   . Hypertension   . Hypothyroidism   . Seizures ([place])   . Urinary incontinence due to cognitive impairment     Past Surgical History:  Procedure Laterality Date  . ABDOMINAL HYSTERECTOMY    . BACK SURGERY    . lumbar back surgery    . TOTAL HIP ARTHROPLASTY Right        HPI  from the history and physical done on the day of admission:    [name] [name]  is a 85 y.o. female  with a history of dementia, skin cancer, hypothyroidism, htn, seizue disorder, CAD, incontinence and  dementia.recent Lt Hip fx and subsequent repair.... Presents to ED on 04/19/2019 from  with failure to thrive, leg weakness, poor oral  intake, history obtained by son due to patient's dementia.    History obtained from patient's caregivers including daughter [name] [name] and son [name] [name]...Marland KitchenMarland KitchenMarland Kitchen  Patient is unable to give history due to advanced dementia.....  EDP initially requested social work consult for possible discharge planning, palliative care also saw patient in the ED  Due to lack of availability of residential hospice bed at this time hospitalist service was called to admit patient to the hospital pending availability of hospice bed at hospice house  COVID-19 test negative on 04/19/2019     Hospital Course:      Brief Summary 85 y.o.femalewith a history of dementia, skin cancer, hypothyroidism, htn, seizue disorder, CAD, incontinence anddementia.recent Lt Hip fx andsubsequent repair admitted on 04/19/2019 from with failure to thrive, leg weakness, poor oral intake, history obtained by son and daughter due to patient's advanced dementia.  A/p 1)FTT/Lt Hip Pain----patient with recent left hip fracture and subsequent repair, continues with some left hip pain  worsening and advancing dementia with failure to thrive, poor oral intake, overall decline,palliative care consult appreciated, patient family has agreed to place to residential hospice for comfort carepending hospice bed availability----full comfort care measures/ordersinitiated -Transfer to residential hospice with comfort measures  2)social/ethics---DNR/DNI, overall prognosis is grave -Transfer to residential hospice with comfort measures  3)H/o SZ and Dementia---dementia has been worsening, patient is now for comfort measures -Transfer to residential hospice with comfort measures  4)CAD/hypothyroidism----chest pain-free, patient is now full comfort measures -Transfer to residential hospice with comfort measures  Disposition-Transfer to residential hospice with comfort measures Code Status : DNR  Family  Communication:   Son and daughter   Disposition Plan  : Hospice House/Transfer to residential hospice with comfort measures  Consults  :  Hospice/Palliative  Discharge Condition: Grave prognosis  Follow UP  [facility] Follow up.   Contact information: [address] [place] [address] [phone]           Diet and Activity recommendation:  As advised  Discharge Instructions    Discharge Instructions    Activity as tolerated - No restrictions   Complete by:  As directed    Diet general   Complete by:  As directed    Discharge instructions   Complete by:  As directed    Transfer to residential hospice with comfort measures        Discharge Medications     Allergies as of 04/22/2019      Reactions   Feldene [piroxicam] Rash   Pt denies allergy to this medication      Medication List    STOP taking these medications   aspirin EC 81 MG tablet   CALCIUM 600 + D PO   donepezil 10 MG tablet Commonly known as:  ARICEPT   fludrocortisone 0.1 MG tablet Commonly known as:  FLORINEF   HYDROcodone-acetaminophen 7.5-325 MG tablet Commonly known as:  NORCO   levocetirizine 2.5 MG/5ML solution Commonly known as:  XYZAL   levothyroxine 75 MCG tablet Commonly known as:  SYNTHROID   omeprazole 20 MG capsule Commonly known as:  PRILOSEC   polyethylene glycol 17 g packet Commonly known as:  MIRALAX / GLYCOLAX   simvastatin 20 MG tablet Commonly known as:  ZOCOR   topiramate 100 MG tablet Commonly known as:  Topamax   Vitamin B-12 1000 MCG Subl   vitamin C 500 MG tablet Commonly known as:  ASCORBIC ACID     TAKE these medications   acetaminophen 325 MG tablet Commonly known as:  TYLENOL Take 2 tablets (650 mg total) by mouth every 6 (six) hours as needed for mild pain (or Fever >/= 101).   acetaminophen 650 MG suppository Commonly known as:  TYLENOL Place 1 suppository (650 mg total) rectally every 6  (six) hours as needed for mild pain or fever (or Fever >/= 101).   antiseptic oral rinse Liqd Apply 15 mLs topically as needed for dry mouth.   chlorproMAZINE 25 MG tablet Commonly known as:  THORAZINE Take 1 tablet (25 mg total) by mouth 4 (four) times daily as needed for hiccoughs.   LORazepam 2 MG/ML concentrated solution Commonly known as:  ATIVAN Place 0.5 mLs (1 mg total) under the tongue every 4 (four) hours as needed for anxiety.   LORazepam 0.5 MG tablet Commonly known as:  ATIVAN Place 1-2 tablets (0.5-1 mg total) under the tongue every 6 (six) hours as needed for anxiety or sleep.  mirtazapine 15 MG disintegrating tablet Commonly known as:  REMERON SOL-TAB Take 1 tablet (15 mg total) by mouth at bedtime.   morphine CONCENTRATE 10 MG/0.5ML Soln concentrated solution Take 0.25 mLs (5 mg total) by mouth every 2 (two) hours as needed for moderate pain (or dyspnea).   morphine CONCENTRATE 10 MG/0.5ML Soln concentrated solution Place 0.25 mLs (5 mg total) under the tongue every 2 (two) hours as needed for moderate pain or severe pain (or dyspnea).   oxyCODONE 5 MG/5ML solution Commonly known as:  ROXICODONE Take 5 mLs (5 mg total) by mouth every 2 (two) hours as needed for moderate pain or severe pain.   polyvinyl alcohol 1.4 % ophthalmic solution Commonly known as:  LIQUIFILM TEARS Place 1 drop into both eyes 4 (four) times daily as needed for dry eyes.   polyvinyl alcohol 1.4 % ophthalmic solution Commonly known as:  LIQUIFILM TEARS Place 2 drops into both eyes as needed for dry eyes.       Major procedures and Radiology Reports - PLEASE review detailed and final reports for all details, in brief -   Recent Results (from the past 240 hour(s))  SARS Coronavirus 2 (CEPHEID - Performed in [place] hospital lab), Hosp Order     Status: None   Collection Time: 04/19/19  6:40 PM  Result Value Ref Range Status   SARS Coronavirus 2 NEGATIVE NEGATIVE Final     Comment: (NOTE) If result is NEGATIVE SARS-CoV-2 target nucleic acids are NOT DETECTED. The SARS-CoV-2 RNA is generally detectable in upper and lower  respiratory specimens during the acute phase of infection. The lowest  concentration of SARS-CoV-2 viral copies this assay can detect is 250  copies / mL. A negative result does not preclude SARS-CoV-2 infection  and should not be used as the sole basis for treatment or other  patient management decisions.  A negative result may occur with  improper specimen collection / handling, submission of specimen other  than nasopharyngeal swab, presence of viral mutation(s) within the  areas targeted by this assay, and inadequate number of viral copies  (<250 copies / mL). A negative result must be combined with clinical  observations, patient history, and epidemiological information. If result is POSITIVE SARS-CoV-2 target nucleic acids are DETECTED. The SARS-CoV-2 RNA is generally detectable in upper and lower  respiratory specimens dur ing the acute phase of infection.  Positive  results are indicative of active infection with SARS-CoV-2.  Clinical  correlation with patient history and other diagnostic information is  necessary to determine patient infection status.  Positive results do  not rule out bacterial infection or co-infection with other viruses. If result is PRESUMPTIVE POSTIVE SARS-CoV-2 nucleic acids MAY BE PRESENT.   A presumptive positive result was obtained on the submitted specimen  and confirmed on repeat testing.  While 2019 novel coronavirus  (SARS-CoV-2) nucleic acids may be present in the submitted sample  additional confirmatory testing may be necessary for epidemiological  and / or clinical management purposes  to differentiate between  SARS-CoV-2 and other Sarbecovirus currently known to infect humans.  If clinically indicated additional testing with an alternate test  methodology [phone]) is advised. The SARS-CoV-2  RNA is generally  detectable in upper and lower respiratory sp ecimens during the acute  phase of infection. The expected result is Negative. Fact Sheet for Patients:  StrictlyIdeas.no Fact Sheet for Healthcare Providers: BankingDealers.co.za This test is not yet approved or cleared by the [place] FDA and has been  authorized for detection and/or diagnosis of SARS-CoV-2 by FDA under an Emergency Use Authorization (EUA).  This EUA will remain in effect (meaning this test can be used) for the duration of the COVID-19 declaration under Section 564(b)(1) of the Act, 21 U.S.C. section 360bbb-3(b)(1), unless the authorization is terminated or revoked sooner. Performed at [facility], [address] [place] [address], [place], [place] [address]        Today   Subjective    [name] [name] today has no new complaints,  Tolerating some oral intake  Transfer to residential hospice with comfort measures..          Patient has been seen and examined prior to discharge   Objective   Blood pressure 110/64, pulse 69, temperature 98.4 F (36.9 C), temperature source Oral, resp. rate 20, height 5\' 1"  (1.549 m), weight 54.4 kg, SpO2 98 %.   Intake/Output Summary (Last 24 hours) at 04/22/2019 1405 Last data filed at 04/22/2019 0900 Gross per 24 hour  Intake 300 ml  Output 500 ml  Net -200 ml    Exam Gen:- Awake , in no acute distress HEENT:- [place].AT, No sclera icterus Neck-Supple Neck,No JVD,.  Lungs-  CTAB , fair symmetrical air movement CV- S1, S2 normal, regular  Abd-  +ve B.Sounds, Abd Soft, No tenderness,    Extremity/Skin:- No  edema, pedal pulses present  Psych-significant cognitive deficits, very advanced dementia  neuro-generalized weakness    Data Review   CBC w Diff:  Lab Results  Component Value Date   WBC 10.2 11/29/2017   HGB 13.8 11/29/2017   HCT 41.1 11/29/2017   PLT 317 11/29/2017   LYMPHOPCT 13 11/23/2017   MONOPCT 6  11/23/2017   EOSPCT 0 11/23/2017   BASOPCT 0 11/23/2017    CMP:  Lab Results  Component Value Date   NA 140 11/29/2017   K 3.7 11/29/2017   CL 111 11/29/2017   CO2 19 (L) 11/29/2017   BUN 5 (L) 11/29/2017   CREATININE 0.53 11/29/2017   PROT 5.8 (L) 11/28/2017   ALBUMIN 2.6 (L) 11/28/2017   BILITOT 0.6 11/28/2017   ALKPHOS 58 11/28/2017   AST 44 (H) 11/28/2017   ALT 19 11/28/2017  .   Total Discharge time is about 33 minutes  Roxan Hockey M.D on 04/22/2019 at 2:05 PM  Go to [email or web address] -  for contact info  [facility] - Office  [phone]

## 2019-04-22 NOTE — Progress Notes (Signed)
Patient removed foley catheter. Patient voided after she self removed foley.  Will monitor patient.  Foley not replaced due to patient discomfort.

## 2019-04-22 NOTE — TOC Transition Note (Signed)
Transition of Care [facility]) - CM/SW Discharge Note   Patient Details  Name: [name] [name] MRN: [id number] Date of Birth: 24-Aug-1918  [facility] CM/SW Contact:  Boneta Lucks, RN Phone Number: 04/22/2019, 12:55 PM   Clinical Narrative:   [facility] has a been, Patient has been assessed by facility staff and EMS set up for 4pm today.  Family notified,  [place] RN will check the chart for DNR and call report.     Final next level of care: [place] Barriers to Discharge: No Barriers Identified   Patient Goals and CMS Choice Patient states their goals for this hospitalization and ongoing recovery are:: family states the goal is Long Term Care       Discharge Placement                Patient to be transferred to facility by: EMS Name of family member notified: Valente David Patient and family notified of of transfer: 04/22/19  Discharge Plan and Services In-house Referral: Clinical Social Work, Hospice / [facility] Acute Care Choice: Nursing Home, Hospice                      [facility] Agency: [facility]        Social Determinants of Health (SDOH) Interventions     Readmission Risk Interventions No flowsheet data found.

## 2019-04-22 NOTE — Care Management Important Message (Signed)
Important Message  Patient Details  Name: [name] [name] MRN: [id number] Date of Birth: 1918/10/13   Medicare Important Message Given:  Yes    Tommy Medal 04/22/2019, 2:31 PM

## 2019-05-25 DEATH — deceased
